# Patient Record
Sex: Male | Born: 1975 | Race: White | Hispanic: No | Marital: Single | Smoking: Never smoker
Health system: Southern US, Community
[De-identification: ages and names within clinical notes are randomized; demographics above are authoritative.]

## PROBLEM LIST (undated history)

## (undated) DIAGNOSIS — T4145XA Adverse effect of unspecified anesthetic, initial encounter: Secondary | ICD-10-CM

## (undated) DIAGNOSIS — F419 Anxiety disorder, unspecified: Secondary | ICD-10-CM

## (undated) DIAGNOSIS — T8859XA Other complications of anesthesia, initial encounter: Secondary | ICD-10-CM

## (undated) DIAGNOSIS — J392 Other diseases of pharynx: Secondary | ICD-10-CM

## (undated) DIAGNOSIS — K4090 Unilateral inguinal hernia, without obstruction or gangrene, not specified as recurrent: Secondary | ICD-10-CM

## (undated) DIAGNOSIS — K760 Fatty (change of) liver, not elsewhere classified: Secondary | ICD-10-CM

## (undated) DIAGNOSIS — F99 Mental disorder, not otherwise specified: Secondary | ICD-10-CM

## (undated) HISTORY — PX: HERNIA REPAIR: SHX51

## (undated) HISTORY — PX: OTHER SURGICAL HISTORY: SHX169

---

## 2014-01-26 ENCOUNTER — Emergency Department (HOSPITAL_COMMUNITY)
Admission: EM | Admit: 2014-01-26 | Discharge: 2014-01-26 | Disposition: A | Discharge status: Home / Self Care | Payer: Medicaid Other | Attending: Emergency Medicine | Admitting: Emergency Medicine

## 2014-01-26 ENCOUNTER — Encounter (HOSPITAL_COMMUNITY): Payer: Self-pay | Admitting: Emergency Medicine

## 2014-01-26 DIAGNOSIS — K4090 Unilateral inguinal hernia, without obstruction or gangrene, not specified as recurrent: Secondary | ICD-10-CM

## 2014-01-26 DIAGNOSIS — F79 Unspecified intellectual disabilities: Secondary | ICD-10-CM | POA: Insufficient documentation

## 2014-01-26 NOTE — ED Notes (Addendum)
Pt arrived to the ED with a complaint of pelvic pain.  Pt states he has had pain all today.  Pt states he has painful urination and pelvic pain situated on his right side. Pt has MR and lives in a group home.  His mother has brought him here today.

## 2014-01-26 NOTE — Discharge Instructions (Signed)
Call the surgeon on Monday for an appointment. His hernia bulges out, have Vincent Singh lay down and apply gentle pressure. She developed significant pain or vomiting he should be reevaluated. Avoid upright activity, this can make the hernia larger and more painful.  Inguinal Hernia, Adult Muscles help keep everything in the body in its proper place. But if a weak spot in the muscles develops, something can poke through. That is called a hernia. When this happens in the lower part of the belly (abdomen), it is called an inguinal hernia. (It takes its name from a part of the body in this region called the inguinal canal.) A weak spot in the wall of muscles lets some fat or part of the small intestine bulge through. An inguinal hernia can develop at any age. Men get them more often than women. CAUSES  In adults, an inguinal hernia develops over time.  It can be triggered by:  Suddenly straining the muscles of the lower abdomen.  Lifting heavy objects.  Straining to have a bowel movement. Difficult bowel movements (constipation) can lead to this.  Constant coughing. This may be caused by smoking or lung disease.  Being overweight.  Being pregnant.  Working at a job that requires long periods of standing or heavy lifting.  Having had an inguinal hernia before. One type can be an emergency situation. It is called a strangulated inguinal hernia. It develops if part of the small intestine slips through the weak spot and cannot get back into the abdomen. The blood supply can be cut off. If that happens, part of the intestine may die. This situation requires emergency surgery. SYMPTOMS  Often, a small inguinal hernia has no symptoms. It is found when a healthcare provider does a physical exam. Larger hernias usually have symptoms.   In adults, symptoms may include:  A lump in the groin. This is easier to see when the person is standing. It might disappear when lying down.  In men, a lump in the  scrotum.  Pain or burning in the groin. This occurs especially when lifting, straining or coughing.  A dull ache or feeling of pressure in the groin.  Signs of a strangulated hernia can include:  A bulge in the groin that becomes very painful and tender to the touch.  A bulge that turns red or purple.  Fever, nausea and vomiting.  Inability to have a bowel movement or to pass gas. DIAGNOSIS  To decide if you have an inguinal hernia, a healthcare provider will probably do a physical examination.  This will include asking questions about any symptoms you have noticed.  The healthcare provider might feel the groin area and ask you to cough. If an inguinal hernia is felt, the healthcare provider may try to slide it back into the abdomen.  Usually no other tests are needed. TREATMENT  Treatments can vary. The size of the hernia makes a difference. Options include:  Watchful waiting. This is often suggested if the hernia is small and you have had no symptoms.  No medical procedure will be done unless symptoms develop.  You will need to watch closely for symptoms. If any occur, contact your healthcare provider right away.  Surgery. This is used if the hernia is larger or you have symptoms.  Open surgery. This is usually an outpatient procedure (you will not stay overnight in a hospital). An cut (incision) is made through the skin in the groin. The hernia is put back inside the abdomen. The  weak area in the muscles is then repaired by herniorrhaphy or hernioplasty. Herniorrhaphy: in this type of surgery, the weak muscles are sewn back together. Hernioplasty: a patch or mesh is used to close the weak area in the abdominal wall.  Laparoscopy. In this procedure, a surgeon makes small incisions. A thin tube with a tiny video camera (called a laparoscope) is put into the abdomen. The surgeon repairs the hernia with mesh by looking with the video camera and using two long instruments. HOME  CARE INSTRUCTIONS   After surgery to repair an inguinal hernia:  You will need to take pain medicine prescribed by your healthcare provider. Follow all directions carefully.  You will need to take care of the wound from the incision.  Your activity will be restricted for awhile. This will probably include no heavy lifting for several weeks. You also should not do anything too active for a few weeks. When you can return to work will depend on the type of job that you have.  During "watchful waiting" periods, you should:  Maintain a healthy weight.  Eat a diet high in fiber (fruits, vegetables and whole grains).  Drink plenty of fluids to avoid constipation. This means drinking enough water and other liquids to keep your urine clear or pale yellow.  Do not lift heavy objects.  Do not stand for long periods of time.  Quit smoking. This should keep you from developing a frequent cough. SEEK MEDICAL CARE IF:   A bulge develops in your groin area.  You feel pain, a burning sensation or pressure in the groin. This might be worse if you are lifting or straining.  You develop a fever of more than 100.5 F (38.1 C). SEEK IMMEDIATE MEDICAL CARE IF:   Pain in the groin increases suddenly.  A bulge in the groin gets bigger suddenly and does not go down.  For men, there is sudden pain in the scrotum. Or, the size of the scrotum increases.  A bulge in the groin area becomes red or purple and is painful to touch.  You have nausea or vomiting that does not go away.  You feel your heart beating much faster than normal.  You cannot have a bowel movement or pass gas.  You develop a fever of more than 102.0 F (38.9 C). Document Released: 05/01/2009 Document Revised: 03/06/2012 Document Reviewed: 05/01/2009 [facility] Patient Information 2014 [place], [place].

## 2014-01-26 NOTE — ED Provider Notes (Signed)
CSN: [id number]     Arrival date & time 01/26/14  2040 History   First MD Initiated Contact with Patient 01/26/14 2100     Chief Complaint  Patient presents with  . Pelvic Pain    HPI  Patient presents with his mom. He resides at a group home. He made the staff aware that he had pain in his groin today. This was after bowling all day. He presents here with his mom. No vomiting. Eating well. Urinating without difficulty.   Past Medical History  Diagnosis Date  . Mental retardation    History reviewed. No pertinent past surgical history. History reviewed. No pertinent family history. History  Substance Use Topics  . Smoking status: Never Smoker   . Smokeless tobacco: Not on file  . Alcohol Use: No    Review of Systems  Constitutional: Negative for fever, chills, diaphoresis, appetite change and fatigue.  HENT: Negative for mouth sores, sore throat and trouble swallowing.   Eyes: Negative for visual disturbance.  Respiratory: Negative for cough, chest tightness, shortness of breath and wheezing.   Cardiovascular: Negative for chest pain.  Gastrointestinal: Negative for nausea, vomiting, abdominal pain, diarrhea and abdominal distention.  Endocrine: Negative for polydipsia, polyphagia and polyuria.  Genitourinary: Positive for scrotal swelling. Negative for dysuria, frequency and hematuria.       Groin pain  Musculoskeletal: Negative for gait problem.  Skin: Negative for color change, pallor and rash.  Neurological: Negative for dizziness, syncope, light-headedness and headaches.  Hematological: Does not bruise/bleed easily.  Psychiatric/Behavioral: Negative for behavioral problems and confusion.    Allergies  Review of patient's allergies indicates not on file.  Home Medications  No current outpatient prescriptions on file. BP 137/87  Pulse 88  Resp 22  Ht 6\' 2"  (1.88 m)  Wt 190 lb (86.183 kg)  BMI 24.38 kg/m2  SpO2 100% Physical Exam  Constitutional: He is  oriented to person, place, and time. He appears well-developed and well-nourished. No distress.  HENT:  Head: Normocephalic.  Eyes: Conjunctivae are normal. Pupils are equal, round, and reactive to light. No scleral icterus.  Neck: Normal range of motion. Neck supple. No thyromegaly present.  Cardiovascular: Normal rate and regular rhythm.  Exam reveals no gallop and no friction rub.   No murmur heard. Pulmonary/Chest: Effort normal and breath sounds normal. No respiratory distress. He has no wheezes. He has no rales.  Abdominal: Soft. Bowel sounds are normal. He exhibits no distension. There is no tenderness. There is no rebound.  Large broad-based right inguinal hernia. Reduces easily. Normal testicular exam.  Abdomen soft nondistended nontender. Normal active bowel sounds.  Musculoskeletal: Normal range of motion.  Neurological: He is alert and oriented to person, place, and time.  Skin: Skin is warm and dry. No rash noted.  Psychiatric: He has a normal mood and affect. His behavior is normal.    ED Course  Procedures (including critical care time) Labs Review Labs Reviewed - No data to display Imaging Review No results found.  EKG Interpretation   None       MDM   1. Inguinal hernia    Hernia immediately reduces. First with gentle pressure. I had Nicholas stand up. It does  recur. However, with laying supine it goes away. I had him stand again. I had him lay supine. I have him apply some gentle pressure. It is easily reduced. This is a broad-based hernia. The chances of incarceration, relation, are small. It is appropriate for outpatient surgical followup.  Rolland PorterMark Errica Dutil, MD 01/26/14 2127

## 2014-02-04 ENCOUNTER — Encounter (INDEPENDENT_AMBULATORY_CARE_PROVIDER_SITE_OTHER): Payer: Self-pay | Admitting: Surgery

## 2014-02-12 ENCOUNTER — Ambulatory Visit (INDEPENDENT_AMBULATORY_CARE_PROVIDER_SITE_OTHER): Payer: MEDICAID | Admitting: Surgery

## 2014-02-25 ENCOUNTER — Ambulatory Visit (INDEPENDENT_AMBULATORY_CARE_PROVIDER_SITE_OTHER): Payer: Medicaid Other | Admitting: Surgery

## 2014-02-25 ENCOUNTER — Encounter (INDEPENDENT_AMBULATORY_CARE_PROVIDER_SITE_OTHER): Payer: Self-pay | Admitting: Surgery

## 2014-02-25 DIAGNOSIS — K4090 Unilateral inguinal hernia, without obstruction or gangrene, not specified as recurrent: Secondary | ICD-10-CM

## 2014-02-25 NOTE — Progress Notes (Signed)
Patient ID: Vincent Singh, male   DOB: 10/15/1976, 38 y.o.   MRN: [id number]  Chief Complaint  Patient presents with  . Hernia    HPI Vincent HoyleCarey S Stern is a 38 y.o. male.  Referred by Elizabeth Palaueresa Anderson, NP for evaluation of RIH.   HPI This is a 38 year old male who has mental retardation who resides in a group home. He is accompanied by his mother. Recently the patient one on a group outing where he went bowling. He began complaining of some pain in his right groin. He was evaluated in the emergency department on 01/26/14 and was noted to have a large reducible right inguinal hernia.  He has not really complained of any discomfort over the last few weeks but the patient is brought in by his mother for surgical evaluation. He denies any obstructive symptoms.   Past Medical History  Diagnosis Date  . Mental retardation     History reviewed. No pertinent past surgical history. Wisdom teeth extraction  History reviewed. No pertinent family history.  Social History History  Substance Use Topics  . Smoking status: Never Smoker   . Smokeless tobacco: Not on file  . Alcohol Use: No    No Known Allergies  Current Outpatient Prescriptions  Medication Sig Dispense Refill  . cholecalciferol (VITAMIN D) 1000 UNITS tablet Take 1,000 Units by mouth every morning.      . Multiple Vitamin (MULTIVITAMIN WITH MINERALS) TABS tablet Take 1 tablet by mouth every morning.       No current facility-administered medications for this visit.    Review of Systems Review of Systems  Constitutional: Negative for fever, chills and unexpected weight change.  HENT: Negative for congestion, hearing loss, sore throat, trouble swallowing and voice change.   Eyes: Negative for visual disturbance.  Respiratory: Negative for cough and wheezing.   Cardiovascular: Negative for chest pain, palpitations and leg swelling.  Gastrointestinal: Negative for nausea, vomiting, abdominal pain, diarrhea, constipation, blood in  stool, abdominal distention, anal bleeding and rectal pain.  Genitourinary: Positive for scrotal swelling and testicular pain. Negative for hematuria and difficulty urinating.  Musculoskeletal: Negative for arthralgias.  Skin: Negative for rash and wound.  Neurological: Negative for seizures, syncope, weakness and headaches.  Hematological: Negative for adenopathy. Does not bruise/bleed easily.  Psychiatric/Behavioral: Negative for confusion.     Physical Exam Physical Exam WDWN in NAD HEENT:  EOMI, sclera anicteric Neck:  No masses, no thyromegaly Lungs:  CTA bilaterally; normal respiratory effort CV:  Regular rate and rhythm; no murmurs Abd:  +bowel sounds, soft, non-tender, no masses GU:  Bilateral descended testes; no testicular masses; large partially reducible right inguinal hernia extending down into the scrotum. Ext:  Well-perfused; no edema Skin:  Warm, dry; no sign of jaundice  Data Reviewed none  Assessment    Large right inguinal hernia - partially reducible     Plan    Right inguinal hernia repair with mesh.  The surgical procedure has been discussed with the patient.  Potential risks, benefits, alternative treatments, and expected outcomes have been explained.  All of the patient's questions at this time have been answered.  The likelihood of reaching the patient's treatment goal is good.  The patient understand the proposed surgical procedure and wishes to proceed. The patient will go home with his mother for a few days after surgery before returning to the group home.         Mally Gavina K. 02/25/2014, 12:55 PM

## 2014-02-26 ENCOUNTER — Encounter (HOSPITAL_COMMUNITY): Payer: Self-pay | Admitting: Pharmacy Technician

## 2014-02-27 ENCOUNTER — Encounter (INDEPENDENT_AMBULATORY_CARE_PROVIDER_SITE_OTHER): Payer: Self-pay

## 2014-02-27 ENCOUNTER — Encounter (HOSPITAL_COMMUNITY)
Admission: RE | Admit: 2014-02-27 | Discharge: 2014-02-27 | Disposition: A | Discharge status: Home / Self Care | Payer: Medicaid Other | Source: Ambulatory Visit | Attending: Surgery | Admitting: Surgery

## 2014-02-27 ENCOUNTER — Encounter (HOSPITAL_COMMUNITY): Payer: Self-pay

## 2014-02-27 DIAGNOSIS — F99 Mental disorder, not otherwise specified: Secondary | ICD-10-CM

## 2014-02-27 HISTORY — DX: Mental disorder, not otherwise specified: F99

## 2014-02-27 HISTORY — DX: Other complications of anesthesia, initial encounter: T88.59XA

## 2014-02-27 HISTORY — DX: Anxiety disorder, unspecified: F41.9

## 2014-02-27 HISTORY — DX: Unilateral inguinal hernia, without obstruction or gangrene, not specified as recurrent: K40.90

## 2014-02-27 HISTORY — DX: Other diseases of pharynx: J39.2

## 2014-02-27 HISTORY — DX: Adverse effect of unspecified anesthetic, initial encounter: T41.45XA

## 2014-02-27 MED ORDER — CHLORHEXIDINE GLUCONATE 4 % EX LIQD: 1.0000 "application " | Freq: Once | CUTANEOUS | Status: DC

## 2014-02-27 NOTE — Pre-Procedure Instructions (Addendum)
PT IS MENTALLY CHALLANGED - HIS MEDICAL HX AND CONSENT FOR SURGERY FROM PT'S MOTHER / HCPO JANE Jaco AND SHE WILL BRING HIM FOR HIS SURGERY. PT IS HEALTHY - MINOR SURGERY - NO PREOP LAB NEEDED PER ANESTHESIOLOGIST'S GUIDELINES.

## 2014-02-27 NOTE — Patient Instructions (Signed)
   [facility]  ON:  Thursday  3/5  REPORT TO  SHORT STAY CENTER AT:  11:00 AM      PHONE # FOR SHORT STAY IS [phone]  DO NOT EAT  ANYTHING AFTER MIDNIGHT THE NIGHT BEFORE YOUR SURGERY.  YOU MAY BRUSH YOUR TEETH,  NO FOOD, NO CHEWING GUM, NO MINTS, NO CANDIES, NO CHEWING TOBACCO. YOU MAY HAVE CLEAR LIQUIDS TO DRINK FROM MIDNIGHT UNTIL 7:30 AM DAY OF SURGERY - LIKE WATER, COFFEE WITH SUGAR - JUST NO MILK OR MILK PRODUCTS.  NOTHING TO DRINK AFTER 7:30 AM DAY OF SURGERY.  PLEASE TAKE THE FOLLOWING MEDICATIONS THE AM OF YOUR SURGERY WITH A FEW SIPS OF WATER:  NO MEDICATIONS TO TAKE   DO NOT BRING VALUABLES, MONEY, CREDIT CARDS.  DO NOT WEAR JEWELRY, MAKE-UP, NAIL POLISH AND NO METAL PINS OR CLIPS IN YOUR HAIR. CONTACT LENS, DENTURES / PARTIALS, GLASSES SHOULD NOT BE WORN TO SURGERY AND IN MOST CASES-HEARING AIDS WILL NEED TO BE REMOVED.  BRING YOUR GLASSES CASE, ANY EQUIPMENT NEEDED FOR YOUR CONTACT LENS. FOR PATIENTS ADMITTED TO THE HOSPITAL--CHECK OUT TIME THE DAY OF DISCHARGE IS 11:00 AM.  ALL INPATIENT ROOMS ARE PRIVATE - WITH BATHROOM, TELEPHONE, TELEVISION AND WIFI INTERNET.  IF YOU ARE BEING DISCHARGED THE SAME DAY OF YOUR SURGERY--YOU CAN NOT DRIVE YOURSELF HOME--AND SHOULD NOT GO HOME ALONE BY TAXI OR BUS.  NO DRIVING OR OPERATING MACHINERY, OR MAKING LEGAL DECISIONS FOR 24 HOURS FOLLOWING ANESTHESIA / PAIN MEDICATIONS.  PLEASE MAKE ARRANGEMENTS FOR SOMEONE TO BE WITH YOU AT HOME THE FIRST 24 HOURS AFTER SURGERY. RESPONSIBLE DRIVER'S NAME / PHONE                                             PT'S MOTHER Vincent Singh  [phone]                                                    FAILURE TO FOLLOW THESE INSTRUCTIONS MAY RESULT IN THE CANCELLATION OF YOUR SURGERY. PLEASE BE AWARE THAT YOU MAY NEED ADDITIONAL BLOOD DRAWN DAY OF YOUR SURGERY  PATIENT SIGNATURE_________________________________

## 2014-02-28 ENCOUNTER — Ambulatory Visit (HOSPITAL_COMMUNITY): Payer: Medicaid Other | Admitting: *Deleted

## 2014-02-28 ENCOUNTER — Encounter (HOSPITAL_COMMUNITY): Payer: Medicaid Other | Admitting: *Deleted

## 2014-02-28 ENCOUNTER — Ambulatory Visit (HOSPITAL_COMMUNITY)
Admission: RE | Admit: 2014-02-28 | Discharge: 2014-02-28 | Disposition: A | Discharge status: Home / Self Care | Payer: Medicaid Other | Source: Ambulatory Visit | Attending: Surgery | Admitting: Surgery

## 2014-02-28 ENCOUNTER — Encounter (HOSPITAL_COMMUNITY)
Admission: RE | Disposition: A | Discharge status: Home / Self Care | Payer: Self-pay | Source: Ambulatory Visit | Attending: Surgery

## 2014-02-28 ENCOUNTER — Encounter (HOSPITAL_COMMUNITY): Payer: Self-pay | Admitting: *Deleted

## 2014-02-28 DIAGNOSIS — F79 Unspecified intellectual disabilities: Secondary | ICD-10-CM | POA: Insufficient documentation

## 2014-02-28 DIAGNOSIS — K4030 Unilateral inguinal hernia, with obstruction, without gangrene, not specified as recurrent: Secondary | ICD-10-CM | POA: Insufficient documentation

## 2014-02-28 DIAGNOSIS — K4090 Unilateral inguinal hernia, without obstruction or gangrene, not specified as recurrent: Secondary | ICD-10-CM

## 2014-02-28 HISTORY — PX: INGUINAL HERNIA REPAIR: SHX194

## 2014-02-28 HISTORY — PX: INSERTION OF MESH: SHX5868

## 2014-02-28 SURGERY — REPAIR, HERNIA, INGUINAL, ADULT
Anesthesia: General | Site: Groin | Laterality: Right

## 2014-02-28 MED ORDER — CEFAZOLIN SODIUM-DEXTROSE 2-3 GM-% IV SOLR
INTRAVENOUS | Status: AC
  Filled 2014-02-28: qty 50

## 2014-02-28 MED ORDER — MIDAZOLAM HCL 5 MG/5ML IJ SOLN
INTRAMUSCULAR | Status: DC | PRN
  Administered 2014-02-28: 1 mg via INTRAVENOUS
  Administered 2014-02-28: 2 mg via INTRAVENOUS
  Administered 2014-02-28: 1 mg via INTRAVENOUS

## 2014-02-28 MED ORDER — FENTANYL CITRATE 0.05 MG/ML IJ SOLN
INTRAMUSCULAR | Status: AC
  Filled 2014-02-28: qty 5

## 2014-02-28 MED ORDER — MIDAZOLAM HCL 2 MG/2ML IJ SOLN
INTRAMUSCULAR | Status: AC
  Filled 2014-02-28: qty 2

## 2014-02-28 MED ORDER — HALOPERIDOL LACTATE 5 MG/ML IJ SOLN
5.0000 mg | Freq: Once | INTRAMUSCULAR | Status: AC
  Administered 2014-02-28: 5 mg via INTRAVENOUS
  Filled 2014-02-28: qty 1

## 2014-02-28 MED ORDER — BUPIVACAINE-EPINEPHRINE 0.25% -1:200000 IJ SOLN
INTRAMUSCULAR | Status: DC | PRN
  Administered 2014-02-28: 10 mL

## 2014-02-28 MED ORDER — OXYCODONE-ACETAMINOPHEN 5-325 MG PO TABS: 1.0000 | ORAL_TABLET | ORAL | Status: DC | PRN

## 2014-02-28 MED ORDER — HYDROMORPHONE HCL PF 1 MG/ML IJ SOLN: 1.0000 mg | INTRAMUSCULAR | Status: DC | PRN

## 2014-02-28 MED ORDER — BUPIVACAINE-EPINEPHRINE PF 0.25-1:200000 % IJ SOLN
INTRAMUSCULAR | Status: AC
  Filled 2014-02-28: qty 30

## 2014-02-28 MED ORDER — BUPIVACAINE LIPOSOME 1.3 % IJ SUSP
20.0000 mL | Freq: Once | INTRAMUSCULAR | Status: AC
  Administered 2014-02-28: 20 mL
  Filled 2014-02-28: qty 20

## 2014-02-28 MED ORDER — FENTANYL CITRATE 0.05 MG/ML IJ SOLN
INTRAMUSCULAR | Status: AC
  Filled 2014-02-28: qty 2

## 2014-02-28 MED ORDER — ONDANSETRON HCL 4 MG/2ML IJ SOLN: 4.0000 mg | INTRAMUSCULAR | Status: DC | PRN

## 2014-02-28 MED ORDER — LACTATED RINGERS IV SOLN
INTRAVENOUS | Status: DC | PRN
  Administered 2014-02-28 (×2): via INTRAVENOUS

## 2014-02-28 MED ORDER — LIDOCAINE HCL (CARDIAC) 20 MG/ML IV SOLN
INTRAVENOUS | Status: DC | PRN
  Administered 2014-02-28: 80 mg via INTRAVENOUS

## 2014-02-28 MED ORDER — HYDROMORPHONE HCL PF 2 MG/ML IJ SOLN
INTRAMUSCULAR | Status: AC
  Filled 2014-02-28: qty 1

## 2014-02-28 MED ORDER — HALOPERIDOL LACTATE 5 MG/ML IJ SOLN
5.0000 mg | Freq: Once | INTRAMUSCULAR | Status: DC
  Filled 2014-02-28: qty 1

## 2014-02-28 MED ORDER — PROPOFOL 10 MG/ML IV BOLUS
INTRAVENOUS | Status: AC
  Filled 2014-02-28: qty 20

## 2014-02-28 MED ORDER — PROPOFOL 10 MG/ML IV BOLUS
INTRAVENOUS | Status: DC | PRN
  Administered 2014-02-28: 200 mg via INTRAVENOUS

## 2014-02-28 MED ORDER — SUCCINYLCHOLINE CHLORIDE 20 MG/ML IJ SOLN
INTRAMUSCULAR | Status: DC | PRN
  Administered 2014-02-28: 100 mg via INTRAVENOUS

## 2014-02-28 MED ORDER — FENTANYL CITRATE 0.05 MG/ML IJ SOLN
25.0000 ug | INTRAMUSCULAR | Status: DC | PRN
  Administered 2014-02-28 (×2): 50 ug via INTRAVENOUS

## 2014-02-28 MED ORDER — MEPERIDINE HCL 50 MG/ML IJ SOLN: 6.2500 mg | INTRAMUSCULAR | Status: DC | PRN

## 2014-02-28 MED ORDER — FENTANYL CITRATE 0.05 MG/ML IJ SOLN
INTRAMUSCULAR | Status: DC | PRN
  Administered 2014-02-28: 50 ug via INTRAVENOUS
  Administered 2014-02-28: 200 ug via INTRAVENOUS

## 2014-02-28 MED ORDER — PROMETHAZINE HCL 25 MG/ML IJ SOLN: 6.2500 mg | INTRAMUSCULAR | Status: DC | PRN

## 2014-02-28 MED ORDER — 0.9 % SODIUM CHLORIDE (POUR BTL) OPTIME
TOPICAL | Status: DC | PRN
  Administered 2014-02-28: 1000 mL

## 2014-02-28 MED ORDER — CEFAZOLIN SODIUM-DEXTROSE 2-3 GM-% IV SOLR
2.0000 g | INTRAVENOUS | Status: AC
  Administered 2014-02-28: 2 g via INTRAVENOUS

## 2014-02-28 MED ORDER — HYDROMORPHONE HCL PF 1 MG/ML IJ SOLN
INTRAMUSCULAR | Status: DC | PRN
  Administered 2014-02-28 (×2): 1 mg via INTRAVENOUS

## 2014-02-28 MED ORDER — LACTATED RINGERS IV SOLN: INTRAVENOUS | Status: DC

## 2014-02-28 SURGICAL SUPPLY — 46 items
APL SKNCLS STERI-STRIP NONHPOA (GAUZE/BANDAGES/DRESSINGS) ×1
BENZOIN TINCTURE PRP APPL 2/3 (GAUZE/BANDAGES/DRESSINGS) ×3 IMPLANT
BLADE HEX COATED 2.75 (ELECTRODE) ×3 IMPLANT
CHLORAPREP W/TINT 26ML (MISCELLANEOUS) ×3 IMPLANT
CLOSURE WOUND 1/2 X4 (GAUZE/BANDAGES/DRESSINGS) ×1
DECANTER SPIKE VIAL GLASS SM (MISCELLANEOUS) ×1 IMPLANT
DISSECTOR ROUND CHERRY 3/8 STR (MISCELLANEOUS) ×2 IMPLANT
DRAIN PENROSE 18X1/2 LTX STRL (DRAIN) ×2 IMPLANT
DRAPE LAPAROTOMY TRNSV 102X78 (DRAPE) ×3 IMPLANT
DRAPE UTILITY XL STRL (DRAPES) ×1 IMPLANT
DRSG TEGADERM 4X4.75 (GAUZE/BANDAGES/DRESSINGS) ×2 IMPLANT
ELECT REM PT RETURN 9FT ADLT (ELECTROSURGICAL) ×3
ELECTRODE REM PT RTRN 9FT ADLT (ELECTROSURGICAL) ×1 IMPLANT
GAUZE SPONGE 4X4 16PLY XRAY LF (GAUZE/BANDAGES/DRESSINGS) IMPLANT
GLOVE BIO SURGEON STRL SZ7 (GLOVE) ×3 IMPLANT
GLOVE BIOGEL PI IND STRL 7.0 (GLOVE) IMPLANT
GLOVE BIOGEL PI IND STRL 7.5 (GLOVE) ×1 IMPLANT
GLOVE BIOGEL PI INDICATOR 7.0 (GLOVE) ×2
GLOVE BIOGEL PI INDICATOR 7.5 (GLOVE) ×2
GLOVE SURG SS PI 8.5 STRL IVOR (GLOVE) ×2
GLOVE SURG SS PI 8.5 STRL STRW (GLOVE) IMPLANT
GOWN STRL REUS W/TWL XL LVL3 (GOWN DISPOSABLE) ×5 IMPLANT
KIT BASIN OR (CUSTOM PROCEDURE TRAY) ×3 IMPLANT
MESH PARIETEX PROGRIP RIGHT (Mesh General) ×2 IMPLANT
NDL HYPO 25X1 1.5 SAFETY (NEEDLE) ×1 IMPLANT
NEEDLE HYPO 25X1 1.5 SAFETY (NEEDLE) IMPLANT
PACK GENERAL/GYN (CUSTOM PROCEDURE TRAY) ×1 IMPLANT
SPONGE GAUZE 4X4 12PLY (GAUZE/BANDAGES/DRESSINGS) ×3 IMPLANT
STRIP CLOSURE SKIN 1/2X4 (GAUZE/BANDAGES/DRESSINGS) ×2 IMPLANT
SUT MNCRL AB 4-0 PS2 18 (SUTURE) ×3 IMPLANT
SUT PROLENE 2 0 SH DA (SUTURE) IMPLANT
SUT SILK 2 0 SH (SUTURE) ×2 IMPLANT
SUT SILK 3 0 (SUTURE)
SUT SILK 3-0 18XBRD TIE 12 (SUTURE) IMPLANT
SUT VIC AB 2-0 CT2 27 (SUTURE) IMPLANT
SUT VIC AB 2-0 SH 27 (SUTURE) ×3
SUT VIC AB 2-0 SH 27X BRD (SUTURE) ×1 IMPLANT
SUT VIC AB 3-0 SH 27 (SUTURE) ×3
SUT VIC AB 3-0 SH 27X BRD (SUTURE) ×1 IMPLANT
SUT VICRYL 0 27 CT2 27 ABS (SUTURE) IMPLANT
SUT VICRYL 0 UR6 27IN ABS (SUTURE) IMPLANT
SYR 20CC LL (SYRINGE) ×1 IMPLANT
TAPE CLOTH SURG 4X10 WHT LF (GAUZE/BANDAGES/DRESSINGS) ×2 IMPLANT
TOWEL OR 17X26 10 PK STRL BLUE (TOWEL DISPOSABLE) ×3 IMPLANT
TOWEL OR NON WOVEN STRL DISP B (DISPOSABLE) ×3 IMPLANT
TRAY LAP CHOLE (CUSTOM PROCEDURE TRAY) ×2 IMPLANT

## 2014-02-28 NOTE — Progress Notes (Signed)
Patient is back from PACU s/p right inguinal hernia repair. Pt has voided and taking PO fluids. Does not want to attempt to walk as he feels so drunk. Resting comfortably with his father at his bedside.

## 2014-02-28 NOTE — Anesthesia Preprocedure Evaluation (Signed)
Anesthesia Evaluation  Patient identified by MRN, date of birth, ID band Patient awake    Reviewed: Allergy & Precautions, H&P , NPO status , Patient's Chart, lab work & pertinent test results  History of Anesthesia Complications (+) Emergence Delirium  Airway Mallampati: II TM Distance: >3 FB Neck ROM: Full    Dental no notable dental hx.    Pulmonary neg pulmonary ROS,  breath sounds clear to auscultation  Pulmonary exam normal       Cardiovascular negative cardio ROS  Rhythm:Regular Rate:Normal     Neuro/Psych Moderate to severe developmental delayed negative psych ROS   GI/Hepatic negative GI ROS, Neg liver ROS,   Endo/Other  negative endocrine ROS  Renal/GU negative Renal ROS  negative genitourinary   Musculoskeletal negative musculoskeletal ROS (+)   Abdominal   Peds negative pediatric ROS (+)  Hematology negative hematology ROS (+)   Anesthesia Other Findings   Reproductive/Obstetrics negative OB ROS                           Anesthesia Physical Anesthesia Plan  ASA: III  Anesthesia Plan: General   Post-op Pain Management:    Induction: Intravenous  Airway Management Planned: Oral ETT  Additional Equipment:   Intra-op Plan:   Post-operative Plan: Extubation in OR  Informed Consent: I have reviewed the patients History and Physical, chart, labs and discussed the procedure including the risks, benefits and alternatives for the proposed anesthesia with the patient or authorized representative who has indicated his/her understanding and acceptance.   Dental advisory given  Plan Discussed with: CRNA  Anesthesia Plan Comments:         Anesthesia Quick Evaluation

## 2014-02-28 NOTE — Op Note (Signed)
Hernia, Open, Procedure Note  Indications: The patient presented with a history of a right, not reducible inguinal hernia.    Pre-operative Diagnosis: right not reducible inguinal hernia Post-operative Diagnosis: same  Surgeon: Wynona LunaSUEI,Amarien Carne K.   Assistants: none  Anesthesia: General endotracheal anesthesia  ASA Class: 2  Procedure Details  The patient was seen again in the Holding Room. The risks, benefits, complications, treatment options, and expected outcomes were discussed with the patient. The possibilities of reaction to medication, pulmonary aspiration, perforation of viscus, bleeding, recurrent infection, the need for additional procedures, and development of a complication requiring transfusion or further operation were discussed with the patient and/or family. The likelihood of success in repairing the hernia and returning the patient to their previous functional status is good.  There was concurrence with the proposed plan, and informed consent was obtained. The site of surgery was properly noted/marked. The patient was taken to the Operating Room, identified as Vincent Singh, and the procedure verified as right inguinal hernia repair. A Time Out was held and the above information confirmed.  The patient was placed in the supine position and underwent induction of anesthesia. The lower abdomen and groin, as well as the scrotum was prepped with Chloraprep and Betadine and draped in the standard fashion, and 0.25% Marcaine with epinephrine was used to anesthetize the skin over the mid-portion of the inguinal canal. An oblique incision was made. Dissection was carried down through the subcutaneous tissue with cautery to the external oblique fascia.  We opened the external oblique fascia along the direction of its fibers to the external ring.  The spermatic cord was circumferentially dissected bluntly and retracted with a Penrose drain. The floor of the inguinal canal was inspected and was  intact except for a loose internal inguinal ring.  We skeletonized the spermatic cord and encountered a very large indirect hernia sac.  We opened the hernia sac and carefully examined the contents.  The bowel was reduced back into the peritoneal cavity.  The spermatic cord is densely adherent to the side of the hernia sac.  We excised most of the hernia sac and closed the neck of the sac with 2.0 silk.  The internal ring was tightened with 0 Vicryl.  We used a right Progrip mesh which was inserted and deployed across the floor of the inguinal canal. The mesh was tucked underneath the external oblique fascia laterally.  The flap of the mesh was closed around the spermatic cord to recreate the internal inguinal ring.  The mesh was secured to the pubic tubercle with 0 Vicryl.  The external oblique fascia was reapproximated with 2-0 Vicryl.  We infiltrated 20 ml of Exparel into the fascia and deep subcutaneous tissue. 3-0 Vicryl was used to close the subcutaneous tissues and 4-0 Monocryl was used to close the skin in subcuticular fashion.  The testicle was palpated down in the scrotum. Benzoin and steri-strips were used to seal the incision.  A clean dressing was applied.  The patient was then extubated and brought to the recovery room in stable condition.  All sponge, instrument, and needle counts were correct prior to closure and at the conclusion of the case.   Estimated Blood Loss: less than 50 mL                 Complications: None; patient tolerated the procedure well.         Disposition: PACU - hemodynamically stable.         Condition: stable  [name]. [name], MD, [name] Surgery  General/ Trauma Surgery  02/28/2014 3:27 PM

## 2014-02-28 NOTE — Transfer of Care (Signed)
Immediate Anesthesia Transfer of Care Note  Patient: Vincent Singh  Procedure(s) Performed: Procedure(s): HERNIA REPAIR INGUINAL ADULT (Right) INSERTION OF MESH (Right)  Patient Location: PACU  Anesthesia Type:General  Level of Consciousness: sedated  Airway & Oxygen Therapy: Patient Spontanous Breathing and Patient connected to face mask oxygen  Post-op Assessment: Report given to PACU RN and Post -op Vital signs reviewed and stable  Post vital signs: Reviewed and stable  Complications: No apparent anesthesia complications

## 2014-02-28 NOTE — Anesthesia Postprocedure Evaluation (Signed)
  Anesthesia Post-op Note  Patient: Vincent Singh  Procedure(s) Performed: Procedure(s) (LRB): HERNIA REPAIR INGUINAL ADULT (Right) INSERTION OF MESH (Right)  Patient Location: PACU  Anesthesia Type: General  Level of Consciousness: awake and alert   Airway and Oxygen Therapy: Patient Spontanous Breathing  Post-op Pain: mild  Post-op Assessment: Post-op Vital signs reviewed, Patient's Cardiovascular Status Stable, Respiratory Function Stable, Patent Airway and No signs of Nausea or vomiting  Last Vitals:  Filed Vitals:   02/28/14 1800  BP: 147/96  Pulse: 109  Temp: 36.3 C  Resp: 16    Post-op Vital Signs: stable   Complications: No apparent anesthesia complications

## 2014-02-28 NOTE — Discharge Instructions (Signed)
[facility] ° ° INGUINAL HERNIA REPAIR: POST OP INSTRUCTIONS ° °Always review your discharge instruction sheet given to you by the facility where your surgery was performed. °IF YOU HAVE DISABILITY OR FAMILY LEAVE FORMS, YOU MUST BRING THEM TO THE OFFICE FOR PROCESSING.   °DO NOT GIVE THEM TO YOUR DOCTOR. ° °1. A  prescription for pain medication may be given to you upon discharge.  Take your pain medication as prescribed, if needed.  If narcotic pain medicine is not needed, then you may take acetaminophen (Tylenol) or ibuprofen (Advil) as needed. °2. Take your usually prescribed medications unless otherwise directed. °3. If you need a refill on your pain medication, please contact your pharmacy.  They will contact our office to request authorization. Prescriptions will not be filled after 5 pm or on week-ends. °4. You should follow a light diet the first 24 hours after arrival home, such as soup and crackers, etc.  Be sure to include lots of fluids daily.  Resume your normal diet the day after surgery. °5. Most patients will experience some swelling and bruising around the umbilicus or in the groin and scrotum.  Ice packs and reclining will help.  Swelling and bruising can take several days to resolve.  °6. It is common to experience some constipation if taking pain medication after surgery.  Increasing fluid intake and taking a stool softener (such as Colace) will usually help or prevent this problem from occurring.  A mild laxative (Milk of Magnesia or Miralax) should be taken according to package directions if there are no bowel movements after 48 hours. °7. Unless discharge instructions indicate otherwise, you may remove your bandages 24-48 hours after surgery, and you may shower at that time.  You will have steri-strips (small skin tapes) in place directly over the incision.  These strips should be left on the skin for 7-10 days. °8. ACTIVITIES:  You may resume regular (light) daily activities  beginning the next day--such as daily self-care, walking, climbing stairs--gradually increasing activities as tolerated.  You may have sexual intercourse when it is comfortable.  Refrain from any heavy lifting or straining until approved by your doctor. °a. You may drive when you are no longer taking prescription pain medication, you can comfortably wear a seatbelt, and you can safely maneuver your car and apply brakes. °b. RETURN TO WORK:  2-3 weeks with light duty - no lifting over 15 lbs. °9. You should see your doctor in the office for a follow-up appointment approximately 2-3 weeks after your surgery.  Make sure that you call for this appointment within a day or two after you arrive home to insure a convenient appointment time. °10. OTHER INSTRUCTIONS:  __________________________________________________________________________________________________________________________________________________________________________________________  °WHEN TO CALL YOUR DOCTOR: °1. Fever over 101.0 °2. Inability to urinate °3. Nausea and/or vomiting °4. Extreme swelling or bruising °5. Continued bleeding from incision. °6. Increased pain, redness, or drainage from the incision ° °The clinic staff is available to answer your questions during regular business hours.  Please don’t hesitate to call and ask to speak to one of the nurses for clinical concerns.  If you have a medical emergency, go to the nearest emergency room or call 911.  A surgeon from [facility] is always on call at the hospital ° ° °[address], [address], [place], [place]  [address] ? ° [address] [address], [place], [place]   [address] °[phone]    [phone]    FAX [phone] °Web site:   [email or web address] ° ° °

## 2014-02-28 NOTE — H&P (View-Only) (Signed)
Patient ID: Vincent Singh, male   DOB: 05/14/1976, 37 y.o.   MRN: [id number]  Chief Complaint  Patient presents with  . Hernia    HPI Vincent Singh is a 38 y.o. male.  Referred by Teresa Anderson, NP for evaluation of RIH.   HPI This is a 38-year-old male who has mental retardation who resides in a group home. He is accompanied by his mother. Recently the patient one on a group outing where he went bowling. He began complaining of some pain in his right groin. He was evaluated in the emergency department on 01/26/14 and was noted to have a large reducible right inguinal hernia.  He has not really complained of any discomfort over the last few weeks but the patient is brought in by his mother for surgical evaluation. He denies any obstructive symptoms.   Past Medical History  Diagnosis Date  . Mental retardation     History reviewed. No pertinent past surgical history. Wisdom teeth extraction  History reviewed. No pertinent family history.  Social History History  Substance Use Topics  . Smoking status: Never Smoker   . Smokeless tobacco: Not on file  . Alcohol Use: No    No Known Allergies  Current Outpatient Prescriptions  Medication Sig Dispense Refill  . cholecalciferol (VITAMIN D) 1000 UNITS tablet Take 1,000 Units by mouth every morning.      . Multiple Vitamin (MULTIVITAMIN WITH MINERALS) TABS tablet Take 1 tablet by mouth every morning.       No current facility-administered medications for this visit.    Review of Systems Review of Systems  Constitutional: Negative for fever, chills and unexpected weight change.  HENT: Negative for congestion, hearing loss, sore throat, trouble swallowing and voice change.   Eyes: Negative for visual disturbance.  Respiratory: Negative for cough and wheezing.   Cardiovascular: Negative for chest pain, palpitations and leg swelling.  Gastrointestinal: Negative for nausea, vomiting, abdominal pain, diarrhea, constipation, blood in  stool, abdominal distention, anal bleeding and rectal pain.  Genitourinary: Positive for scrotal swelling and testicular pain. Negative for hematuria and difficulty urinating.  Musculoskeletal: Negative for arthralgias.  Skin: Negative for rash and wound.  Neurological: Negative for seizures, syncope, weakness and headaches.  Hematological: Negative for adenopathy. Does not bruise/bleed easily.  Psychiatric/Behavioral: Negative for confusion.     Physical Exam Physical Exam WDWN in NAD HEENT:  EOMI, sclera anicteric Neck:  No masses, no thyromegaly Lungs:  CTA bilaterally; normal respiratory effort CV:  Regular rate and rhythm; no murmurs Abd:  +bowel sounds, soft, non-tender, no masses GU:  Bilateral descended testes; no testicular masses; large partially reducible right inguinal hernia extending down into the scrotum. Ext:  Well-perfused; no edema Skin:  Warm, dry; no sign of jaundice  Data Reviewed none  Assessment    Large right inguinal hernia - partially reducible     Plan    Right inguinal hernia repair with mesh.  The surgical procedure has been discussed with the patient.  Potential risks, benefits, alternative treatments, and expected outcomes have been explained.  All of the patient's questions at this time have been answered.  The likelihood of reaching the patient's treatment goal is good.  The patient understand the proposed surgical procedure and wishes to proceed. The patient will go home with his mother for a few days after surgery before returning to the group home.         Vincent Singh K. 02/25/2014, 12:55 PM    

## 2014-02-28 NOTE — Interval H&P Note (Signed)
History and Physical Interval Note:  02/28/2014 10:45 AM  Vincent Singh  has presented today for surgery, with the diagnosis of right inguinal hernia   The various methods of treatment have been discussed with the patient and family. After consideration of risks, benefits and other options for treatment, the patient has consented to  Procedure(s): HERNIA REPAIR INGUINAL ADULT (Right) INSERTION OF MESH (Right) as a surgical intervention .  The patient's history has been reviewed, patient examined, no change in status, stable for surgery.  I have reviewed the patient's chart and labs.  Questions were answered to the patient's satisfaction.     Antasia Haider K.

## 2014-03-01 ENCOUNTER — Encounter (HOSPITAL_COMMUNITY): Payer: Self-pay | Admitting: Surgery

## 2014-03-18 ENCOUNTER — Encounter (INDEPENDENT_AMBULATORY_CARE_PROVIDER_SITE_OTHER): Payer: Self-pay | Admitting: Surgery

## 2014-03-18 ENCOUNTER — Ambulatory Visit (INDEPENDENT_AMBULATORY_CARE_PROVIDER_SITE_OTHER): Payer: Medicaid Other | Admitting: Surgery

## 2014-03-18 VITALS — BP 124/74 | HR 76 | Temp 98.4°F | Resp 14 | Ht 72.0 in | Wt 190.0 lb

## 2014-03-18 DIAGNOSIS — K4090 Unilateral inguinal hernia, without obstruction or gangrene, not specified as recurrent: Secondary | ICD-10-CM

## 2014-03-18 NOTE — Progress Notes (Signed)
Status post right inguinal hernia repair with mesh on 02/28/14 for a very large indirect hernia. The patient is accompanied by his father. The patient has no complaints. He never take any pain medication. He does have a lot of swelling in his right hemiscrotum. No sign of recurrent hernia. His incision is well-healed with no sign of infection. He likely had accumulation of seroma or hematoma in his right hemiscrotum. He denies any tenderness in this area. This should resolve over time. We will reevaluate him in 3-4 weeks to make sure that this is improving. He should limit his activity for 2 more weeks.  Wilmon ArmsMatthew K. Corliss Skainssuei, MD, [facility]  General/ Trauma Surgery  03/18/2014 12:27 PM

## 2014-04-15 ENCOUNTER — Encounter (INDEPENDENT_AMBULATORY_CARE_PROVIDER_SITE_OTHER): Payer: Self-pay | Admitting: Surgery

## 2014-04-15 ENCOUNTER — Ambulatory Visit (INDEPENDENT_AMBULATORY_CARE_PROVIDER_SITE_OTHER): Payer: Medicaid Other | Admitting: Surgery

## 2014-04-15 VITALS — BP 122/70 | HR 72 | Temp 98.2°F | Resp 18 | Ht 73.0 in | Wt 192.4 lb

## 2014-04-15 DIAGNOSIS — K4090 Unilateral inguinal hernia, without obstruction or gangrene, not specified as recurrent: Secondary | ICD-10-CM

## 2014-04-15 NOTE — Progress Notes (Signed)
Status post right inguinal hernia repair with mesh on 02/28/14. At his initial postop visit he has significant swelling in his right hemiscrotum. He remained swollen but has decreased significantly in size. The incision looks good with no sign of infection or hematoma. No sign of recurrent hernia. The patient may resume full duty. Followup as needed.  Wilmon ArmsMatthew K. Corliss Skainssuei, MD, [facility]  General/ Trauma Surgery  04/15/2014 9:51 AM

## 2019-10-15 ENCOUNTER — Other Ambulatory Visit: Payer: Self-pay

## 2019-10-15 DIAGNOSIS — Z20822 Contact with and (suspected) exposure to covid-19: Secondary | ICD-10-CM

## 2019-10-17 LAB — NOVEL CORONAVIRUS, NAA: SARS-CoV-2, NAA: NOT DETECTED

## 2020-09-29 ENCOUNTER — Other Ambulatory Visit: Payer: Self-pay

## 2020-09-30 ENCOUNTER — Emergency Department (HOSPITAL_COMMUNITY): Payer: Medicaid Other

## 2020-09-30 ENCOUNTER — Encounter (HOSPITAL_COMMUNITY): Payer: Self-pay | Admitting: Emergency Medicine

## 2020-09-30 ENCOUNTER — Other Ambulatory Visit: Payer: Self-pay

## 2020-09-30 ENCOUNTER — Inpatient Hospital Stay (HOSPITAL_COMMUNITY)
Admission: EM | Admit: 2020-09-30 | Discharge: 2020-10-04 | DRG: 394 | Disposition: A | Discharge status: Home / Self Care | Payer: Medicaid Other | Attending: Internal Medicine | Admitting: Internal Medicine

## 2020-09-30 DIAGNOSIS — F419 Anxiety disorder, unspecified: Secondary | ICD-10-CM | POA: Diagnosis present

## 2020-09-30 DIAGNOSIS — E739 Lactose intolerance, unspecified: Secondary | ICD-10-CM | POA: Diagnosis present

## 2020-09-30 DIAGNOSIS — E860 Dehydration: Secondary | ICD-10-CM | POA: Diagnosis present

## 2020-09-30 DIAGNOSIS — E876 Hypokalemia: Secondary | ICD-10-CM | POA: Diagnosis present

## 2020-09-30 DIAGNOSIS — K56609 Unspecified intestinal obstruction, unspecified as to partial versus complete obstruction: Secondary | ICD-10-CM

## 2020-09-30 DIAGNOSIS — N179 Acute kidney failure, unspecified: Secondary | ICD-10-CM

## 2020-09-30 DIAGNOSIS — K760 Fatty (change of) liver, not elsewhere classified: Secondary | ICD-10-CM | POA: Diagnosis present

## 2020-09-30 DIAGNOSIS — K4030 Unilateral inguinal hernia, with obstruction, without gangrene, not specified as recurrent: Secondary | ICD-10-CM | POA: Diagnosis present

## 2020-09-30 DIAGNOSIS — F79 Unspecified intellectual disabilities: Secondary | ICD-10-CM | POA: Diagnosis present

## 2020-09-30 DIAGNOSIS — K460 Unspecified abdominal hernia with obstruction, without gangrene: Secondary | ICD-10-CM | POA: Diagnosis present

## 2020-09-30 DIAGNOSIS — Z20822 Contact with and (suspected) exposure to covid-19: Secondary | ICD-10-CM | POA: Diagnosis present

## 2020-09-30 DIAGNOSIS — K4031 Unilateral inguinal hernia, with obstruction, without gangrene, recurrent: Principal | ICD-10-CM | POA: Diagnosis present

## 2020-09-30 DIAGNOSIS — E871 Hypo-osmolality and hyponatremia: Secondary | ICD-10-CM | POA: Diagnosis present

## 2020-09-30 DIAGNOSIS — R7301 Impaired fasting glucose: Secondary | ICD-10-CM | POA: Diagnosis present

## 2020-09-30 DIAGNOSIS — J392 Other diseases of pharynx: Secondary | ICD-10-CM | POA: Diagnosis present

## 2020-09-30 LAB — CBC
HCT: 57.3 % — ABNORMAL HIGH (ref 39.0–52.0)
Hemoglobin: 20.5 g/dL — ABNORMAL HIGH (ref 13.0–17.0)
MCH: 29 pg (ref 26.0–34.0)
MCHC: 35.8 g/dL (ref 30.0–36.0)
MCV: 81 fL (ref 80.0–100.0)
Platelets: 265 10*3/uL (ref 150–400)
RBC: 7.07 MIL/uL — ABNORMAL HIGH (ref 4.22–5.81)
RDW: 13 % (ref 11.5–15.5)
WBC: 9.1 10*3/uL (ref 4.0–10.5)
nRBC: 0 % (ref 0.0–0.2)

## 2020-09-30 LAB — COMPREHENSIVE METABOLIC PANEL
ALT: 153 U/L — ABNORMAL HIGH (ref 0–44)
AST: 47 U/L — ABNORMAL HIGH (ref 15–41)
Albumin: 4.8 g/dL (ref 3.5–5.0)
Alkaline Phosphatase: 56 U/L (ref 38–126)
Anion gap: 21 — ABNORMAL HIGH (ref 5–15)
BUN: 80 mg/dL — ABNORMAL HIGH (ref 6–20)
CO2: 21 mmol/L — ABNORMAL LOW (ref 22–32)
Calcium: 9.3 mg/dL (ref 8.9–10.3)
Chloride: 84 mmol/L — ABNORMAL LOW (ref 98–111)
Creatinine, Ser: 2.88 mg/dL — ABNORMAL HIGH (ref 0.61–1.24)
GFR calc non Af Amer: 25 mL/min — ABNORMAL LOW (ref >60)
Glucose, Bld: 160 mg/dL — ABNORMAL HIGH (ref 70–99)
Potassium: 3.6 mmol/L (ref 3.5–5.1)
Sodium: 126 mmol/L — ABNORMAL LOW (ref 135–145)
Total Bilirubin: 1.7 mg/dL — ABNORMAL HIGH (ref 0.3–1.2)
Total Protein: 8.7 g/dL — ABNORMAL HIGH (ref 6.5–8.1)

## 2020-09-30 LAB — RESPIRATORY PANEL BY RT PCR (FLU A&B, COVID)
Influenza A by PCR: NEGATIVE
Influenza B by PCR: NEGATIVE
SARS Coronavirus 2 by RT PCR: NEGATIVE

## 2020-09-30 LAB — LIPASE, BLOOD: Lipase: 21 U/L (ref 11–51)

## 2020-09-30 MED ORDER — PROMETHAZINE HCL 25 MG/ML IJ SOLN
12.5000 mg | Freq: Four times a day (QID) | INTRAMUSCULAR | Status: DC | PRN
  Administered 2020-10-01: 12.5 mg via INTRAVENOUS
  Filled 2020-09-30: qty 1

## 2020-09-30 MED ORDER — SODIUM CHLORIDE 0.9 % IV BOLUS
1000.0000 mL | Freq: Once | INTRAVENOUS | Status: AC
  Administered 2020-09-30: 1000 mL via INTRAVENOUS

## 2020-09-30 MED ORDER — PANTOPRAZOLE SODIUM 40 MG IV SOLR
40.0000 mg | Freq: Once | INTRAVENOUS | Status: AC
  Administered 2020-09-30: 40 mg via INTRAVENOUS
  Filled 2020-09-30: qty 40

## 2020-09-30 MED ORDER — PROMETHAZINE HCL 25 MG/ML IJ SOLN
12.5000 mg | Freq: Once | INTRAMUSCULAR | Status: AC
  Administered 2020-09-30: 12.5 mg via INTRAVENOUS
  Filled 2020-09-30: qty 1

## 2020-09-30 MED ORDER — SODIUM CHLORIDE 0.9 % IV SOLN: INTRAVENOUS | Status: DC

## 2020-09-30 MED ORDER — ONDANSETRON HCL 4 MG/2ML IJ SOLN
4.0000 mg | Freq: Four times a day (QID) | INTRAMUSCULAR | Status: DC | PRN
  Administered 2020-10-02: 4 mg via INTRAVENOUS
  Filled 2020-09-30: qty 2

## 2020-09-30 MED ORDER — SODIUM CHLORIDE 0.9 % IV SOLN
8.0000 mg | Freq: Once | INTRAVENOUS | Status: AC
  Administered 2020-09-30: 8 mg via INTRAVENOUS
  Filled 2020-09-30: qty 4

## 2020-09-30 NOTE — Consult Note (Signed)
Surgical Evaluation Requesting provider: Army Melia Singh  Chief Complaint: vomiting  HPI: 44 year old gentleman with history of mental disorder living in a group home, and prior open right inguinal hernia repair (direct, ProGrip mesh, Vincent Singh 2015) who presents to the [facility], ER with frequent emesis episodes beginning 3 days ago and inability to tolerate p.o.  The patient denies abdominal pain.  Vomiting was refractory to Zofran.  Denies fevers or chills.  Unknown last bowel movement.  On initial evaluation he was noted to be hyponatremic with acute kidney injury with a creatinine of 2.88 and clearly dehydrated.  A CT scan has demonstrated a small bowel obstruction secondary to an incarcerated recurrent right inguinal hernia.  No Known Allergies  Past Medical History:  Diagnosis Date   Anxiety    ANXIETY OVER SURGERY AND NEEDLES- TALKS A LOT WHEN ANXIOUS   Complication of anesthesia    SOME AGGITATION WAKING UP AFTER WISDOM TEETH EXTRACTED   Hyperactive gag reflex    DOES NOT TOLERATE ORAL THERMOMETER   Inguinal hernia    RIGHT INGUINAL HERNIA - CAUSED PAIN ABOUT 3 WEEKS    Mental disorder 02/27/14   MENTALLY CHALLANGED-LIVES IN GROUP HOME - ALERT - ABLE TO EXPRESS NEEDS AND PERFORMS HIS ACTIVITES OF DAILY LIVING INDEPENDENTLY. HIS MOTHER / HCPOA  Vincent Singh WITH HIM TODAY AT [facility] PREOP AND ANSWERED MEDICAL HX QUESTIONS.    Past Surgical History:  Procedure Laterality Date   HERNIA REPAIR     INGUINAL HERNIA REPAIR Right 02/28/2014   Procedure: HERNIA REPAIR INGUINAL ADULT;  Surgeon: Vincent Singh;  Location: [facility];  Service: General;  Laterality: Right;   INSERTION OF MESH Right 02/28/2014   Procedure: INSERTION OF MESH;  Surgeon: Vincent Singh;  Location: [facility];  Service: General;  Laterality: Right;   WISDOM TEETH EXTRACTIONS - 20 YRS AGO      History reviewed. No pertinent family history.  Social History   Socioeconomic History   Marital status:  Single    Spouse name: Not on file   Number of children: Not on file   Years of education: Not on file   Highest education level: Not on file  Occupational History   Not on file  Tobacco Use   Smoking status: Never Smoker   Smokeless tobacco: Never Used  Substance and Sexual Activity   Alcohol use: No   Drug use: No   Sexual activity: Never  Other Topics Concern   Not on file  Social History Narrative   Not on file   Social Determinants of Health   Financial Resource Strain:    Difficulty of Paying Living Expenses: Not on file  Food Insecurity:    Worried About Running Out of Food in the Last Year: Not on file   Ran Out of Food in the Last Year: Not on file  Transportation Needs:    Lack of Transportation (Medical): Not on file   Lack of Transportation (Non-Medical): Not on file  Physical Activity:    Days of Exercise per Week: Not on file   Minutes of Exercise per Session: Not on file  Stress:    Feeling of Stress : Not on file  Social Connections:    Frequency of Communication with Friends and Family: Not on file   Frequency of Social Gatherings with Friends and Family: Not on file   Attends Religious Services: Not on file   Active Member of Clubs or Organizations: Not on file  Attends Banker Meetings: Not on file   Marital Status: Not on file    No current facility-administered medications on file prior to encounter.   Current Outpatient Medications on File Prior to Encounter  Medication Sig Dispense Refill   ergocalciferol (VITAMIN D2) 1.25 MG (50000 UT) capsule Take 50,000 Units by mouth once a week. Tuesdays     Multiple Vitamin (MULTIVITAMIN WITH MINERALS) TABS tablet Take 1 tablet by mouth daily.      ondansetron (ZOFRAN-ODT) 4 MG disintegrating tablet Take 4 mg by mouth every 8 (eight) hours as needed for nausea or vomiting.      Review of Systems: a complete, 10pt review of systems was completed with pertinent  positives and negatives as documented in the HPI  Physical Exam: Vitals:   09/30/20 1930 09/30/20 2022  BP: (!) 133/93 122/80  Pulse: (!) 110 (!) 119  Resp: 17 17  Temp:    SpO2: 99% 100%   Gen: Alert and cooperative, no distress, but hiccuping frequently and one episode of small-volume emesis while I was in the room. Eyes: lids and conjunctivae normal, no icterus. Pupils equally round and reactive to light.  Neck: supple without mass or thyromegaly Chest: respiratory effort is normal. No crepitus or tenderness on palpation of the chest. Breath sounds equal.  Cardiovascular: Tachycardic to 108, regular, palpable distal pulses, no pedal edema Gastrointestinal: soft, minimally distended, nontender. No mass, hepatomegaly or splenomegaly.  There is a recurrent right inguinal hernia which is nontender, no overlying skin changes.  This was able to be reduced after a couple minutes of gentle pressure. Lymphatic: no lymphadenopathy in the neck or groin Muscoloskeletal: no clubbing or cyanosis of the fingers.  Strength is symmetrical throughout.  Range of motion of bilateral upper and lower extremities normal without pain, crepitation or contracture. Neuro: cranial nerves grossly intact.  Sensation intact to light touch diffusely. Psych: Baseline mood and affect per caregiver, insight/judgment limited Skin: warm and dry   CBC Latest Ref Rng & Units 09/30/2020  WBC 4.0 - 10.5 K/uL 9.1  Hemoglobin 13.0 - 17.0 g/dL 20.5(H)  Hematocrit 39 - 52 % 57.3(H)  Platelets 150 - 400 K/uL 265    CMP Latest Ref Rng & Units 09/30/2020  Glucose 70 - 99 mg/dL 209(O)  BUN 6 - 20 mg/dL 70(J)  Creatinine 6.28 - 1.24 mg/dL 3.66(Q)  Sodium 947 - 654 mmol/L 126(L)  Potassium 3.5 - 5.1 mmol/L 3.6  Chloride 98 - 111 mmol/L 84(L)  CO2 22 - 32 mmol/L 21(L)  Calcium 8.9 - 10.3 mg/dL 9.3  Total Protein 6.5 - 8.1 g/dL 6.5(K)  Total Bilirubin 0.3 - 1.2 mg/dL 3.5(W)  Alkaline Phos 38 - 126 U/L 56  AST 15 - 41 U/L  47(H)  ALT 0 - 44 U/L 153(H)    No results found for: INR, PROTIME  Imaging: CT Abdomen Pelvis Wo Contrast  Result Date: 09/30/2020 CLINICAL DATA:  Nausea and vomiting EXAM: CT ABDOMEN AND PELVIS WITHOUT CONTRAST TECHNIQUE: Multidetector CT imaging of the abdomen and pelvis was performed following the standard protocol without IV contrast. COMPARISON:  Ultrasound from earlier in the same day. FINDINGS: Lower chest: No acute abnormality. Hepatobiliary: Liver is within normal limits. Gallbladder is contracted. Known gallstones are not well appreciated on this exam. Pancreas: Unremarkable. No pancreatic ductal dilatation or surrounding inflammatory changes. Spleen: Normal in size without focal abnormality. Adrenals/Urinary Tract: Adrenal glands are within normal limits. Kidneys demonstrate no renal calculi or urinary tract obstructive changes. Stomach/Bowel:  Scattered diverticular change of the colon is noted. No evidence of diverticulitis is seen. The appendix is within normal limits. Terminal ileum is unremarkable. In the mid to distal ileum there is evidence of a right inguinal hernia containing a loop of distal small bowel causing proximal small bowel obstruction. Fluid is noted within the distal esophagus consistent with reflux. The stomach is otherwise within normal limits. Vascular/Lymphatic: No significant vascular findings are present. No enlarged abdominal or pelvic lymph nodes. Reproductive: Prostate is unremarkable. Other: No abdominal ascites is seen. Right inguinal hernia is noted as described above containing a loop of incarcerated small bowel causing proximal small-bowel obstruction involving the jejunum and proximal ileum. Musculoskeletal: No acute or significant osseous findings. IMPRESSION: Small-bowel obstruction in the mid to distal ileum secondary to an incarcerated right inguinal hernia. Previously seen cholelithiasis is not well appreciated. No other focal abnormality is noted.  Electronically Signed   By: Alcide Clever M.D.   On: 09/30/2020 20:25   DG Abdomen 1 View  Result Date: 09/30/2020 CLINICAL DATA:  Constipation vomiting EXAM: ABDOMEN - 1 VIEW COMPARISON:  None. FINDINGS: Dilated small bowel loops in the central abdomen up to 4.6 cm. Some scattered colon gas but relative paucity. No radiopaque calculi. IMPRESSION: Dilated small bowel loops in the central abdomen, appearance is concerning for small bowel obstruction. Electronically Signed   By: Jasmine Pang M.D.   On: 09/30/2020 19:24   US Abdomen Limited  Result Date: 09/30/2020 CLINICAL DATA:  Right upper quadrant pain with elevated LFTs EXAM: ULTRASOUND ABDOMEN LIMITED RIGHT UPPER QUADRANT COMPARISON:  None. FINDINGS: Gallbladder: Decompressed with multiple gallstones within. No pericholecystic fluid is noted. Negative sonographic Murphy's sign is elicited. Common bile duct: Diameter: 3 mm. Liver: Mild increased echogenicity is noted consistent with fatty infiltration. No focal mass is seen. Portal vein is patent on color Doppler imaging with normal direction of blood flow towards the liver. Other: None. IMPRESSION: Decompressed gallbladder with multiple gallstones within. Fatty liver. No acute abnormality noted. Electronically Signed   By: Alcide Clever M.D.   On: 09/30/2020 20:00     A/P: 44 year old gentleman with small bowel obstruction secondary to an incarcerated recurrent right inguinal hernia, which has now been reduced.  Hopefully this will allow the obstruction to improve and the bowel to decompress.  He reportedly cannot tolerate/refuses NG tube, but if the emesis persists despite the hernia being reduced an NG tube would certainly improve his comfort.  I discussed with the patient as well as with his mother by phone that I would recommend proceeding with repair of the recurrent hernia in order to prevent recurrence of the incarceration; as long as the hernia remains reducible and the obstruction resolves it  is not mandatory to do that while admitted but I would recommend considering it.  For this evening would keep strictly n.p.o., fluid resuscitate, repeat labs in the morning to ensure improvement in creatinine, will order plain films for the morning.    Patient Active Problem List   Diagnosis Date Noted   Right inguinal hernia 02/25/2014       Phylliss Blakes, Singh [facility], [place]  See AMION to contact appropriate on-call provider

## 2020-09-30 NOTE — ED Notes (Signed)
To to ultra Sound and then Xray.

## 2020-09-30 NOTE — ED Provider Notes (Signed)
[facility] Provider Note   CSN: [id number] Arrival date & time: 09/30/20  1426     History Chief Complaint  Patient presents with   Emesis   Nausea    Vincent Singh is a 44 y.o. male.  44 year old male brought in by group home staff for vomiting since early Saturday morning.  Staff reports patient has vomited at least 10 times in the 4 hours he has been in the lobby waiting.  Staff has tried to give patient Zofran at the facility without improvement.  Patient was seen in urgent care yesterday, had a negative Covid test.  Patient denies fevers, chills, abdominal pain.  Last bowel movement is unknown.  Patient denies any significant medical history, takes a multivitamin daily.        Past Medical History:  Diagnosis Date   Anxiety    ANXIETY OVER SURGERY AND NEEDLES- TALKS A LOT WHEN ANXIOUS   Complication of anesthesia    SOME AGGITATION WAKING UP AFTER WISDOM TEETH EXTRACTED   Hyperactive gag reflex    DOES NOT TOLERATE ORAL THERMOMETER   Inguinal hernia    RIGHT INGUINAL HERNIA - CAUSED PAIN ABOUT 3 WEEKS    Mental disorder 02/27/14   MENTALLY CHALLANGED-LIVES IN GROUP HOME - ALERT - ABLE TO EXPRESS NEEDS AND PERFORMS HIS ACTIVITES OF DAILY LIVING INDEPENDENTLY. HIS MOTHER / HCPOA  JANE Nedd [facility] AND ANSWERED MEDICAL HX QUESTIONS.    Patient Active Problem List   Diagnosis Date Noted   Right inguinal hernia 02/25/2014    Past Surgical History:  Procedure Laterality Date   HERNIA REPAIR     INGUINAL HERNIA REPAIR Right 02/28/2014   Procedure: HERNIA REPAIR INGUINAL ADULT;  Surgeon: Wilmon ArmsMatthew K. Corliss Skainssuei, MD;  Location: [facility];  Service: General;  Laterality: Right;   INSERTION OF MESH Right 02/28/2014   Procedure: INSERTION OF MESH;  Surgeon: Wilmon ArmsMatthew K. Corliss Skainssuei, MD;  Location: [facility];  Service: General;  Laterality: Right;   WISDOM TEETH EXTRACTIONS - 20 YRS AGO         History reviewed. No  pertinent family history.  Social History   Tobacco Use   Smoking status: Never Smoker   Smokeless tobacco: Never Used  Substance Use Topics   Alcohol use: No   Drug use: No    Home Medications Prior to Admission medications   Medication Sig Start Date End Date Taking? Authorizing Provider  ergocalciferol (VITAMIN D2) 1.25 MG (50000 UT) capsule Take 50,000 Units by mouth once a week. Tuesdays   Yes [provider]  Multiple Vitamin (MULTIVITAMIN WITH MINERALS) TABS tablet Take 1 tablet by mouth daily.    Yes [provider]  ondansetron (ZOFRAN-ODT) 4 MG disintegrating tablet Take 4 mg by mouth every 8 (eight) hours as needed for nausea or vomiting.   Yes [provider]    Allergies    Patient has no known allergies.  Review of Systems   Review of Systems  Constitutional: Negative for chills and fever.  Respiratory: Negative for shortness of breath.   Cardiovascular: Negative for chest pain.  Gastrointestinal: Positive for constipation, nausea and vomiting. Negative for abdominal pain and diarrhea.  Musculoskeletal: Negative for arthralgias and myalgias.  Skin: Negative for rash and wound.  Allergic/Immunologic: Negative for immunocompromised state.  Neurological: Negative for dizziness, weakness and headaches.  Psychiatric/Behavioral: Negative for confusion.  All other systems reviewed and are negative.   Physical Exam Updated Vital Signs  BP (!) 128/91    Pulse (!) 109    Temp 97.7 F (36.5 C) (Axillary)    Resp 17    Ht 6\' 3"  (1.905 m)    Wt 85.3 kg    SpO2 97%    BMI 23.50 kg/m   Physical Exam Vitals and nursing note reviewed.  Constitutional:      General: He is not in acute distress.    Appearance: He is well-developed. He is not diaphoretic.  HENT:     Head: Normocephalic and atraumatic.     Mouth/Throat:     Mouth: Mucous membranes are dry.  Cardiovascular:     Rate and Rhythm: Regular rhythm. Tachycardia present.      Pulses: Normal pulses.  Pulmonary:     Effort: Pulmonary effort is normal.     Breath sounds: Normal breath sounds.  Abdominal:     Palpations: Abdomen is soft.     Tenderness: There is no abdominal tenderness.     Hernia: A hernia is present. Hernia is present in the right inguinal area.     Comments: Non tender right inguinal hernia  Musculoskeletal:     Right lower leg: No edema.     Left lower leg: No edema.  Skin:    General: Skin is warm and dry.     Findings: No erythema or rash.  Neurological:     Mental Status: He is alert and oriented to person, place, and time.  Psychiatric:        Behavior: Behavior normal.     ED Results / Procedures / Treatments   Labs (all labs ordered are listed, but only abnormal results are displayed) Labs Reviewed  COMPREHENSIVE METABOLIC PANEL - Abnormal; Notable for the following components:      Result Value   Sodium 126 (*)    Chloride 84 (*)    CO2 21 (*)    Glucose, Bld 160 (*)    BUN 80 (*)    Creatinine, Ser 2.88 (*)    Total Protein 8.7 (*)    AST 47 (*)    ALT 153 (*)    Total Bilirubin 1.7 (*)    GFR calc non Af Amer 25 (*)    Anion gap 21 (*)    All other components within normal limits  CBC - Abnormal; Notable for the following components:   RBC 7.07 (*)    Hemoglobin 20.5 (*)    HCT 57.3 (*)    All other components within normal limits  RESPIRATORY PANEL BY RT PCR (FLU A&B, COVID)  LIPASE, BLOOD  URINALYSIS, ROUTINE W REFLEX MICROSCOPIC  PH, GASTRIC FLUID (GASTROCCULT CARD)  CBC  BASIC METABOLIC PANEL    EKG None  Radiology CT Abdomen Pelvis Wo Contrast  Result Date: 09/30/2020 CLINICAL DATA:  Nausea and vomiting EXAM: CT ABDOMEN AND PELVIS WITHOUT CONTRAST TECHNIQUE: Multidetector CT imaging of the abdomen and pelvis was performed following the standard protocol without IV contrast. COMPARISON:  Ultrasound from earlier in the same day. FINDINGS: Lower chest: No acute abnormality. Hepatobiliary: Liver is  within normal limits. Gallbladder is contracted. Known gallstones are not well appreciated on this exam. Pancreas: Unremarkable. No pancreatic ductal dilatation or surrounding inflammatory changes. Spleen: Normal in size without focal abnormality. Adrenals/Urinary Tract: Adrenal glands are within normal limits. Kidneys demonstrate no renal calculi or urinary tract obstructive changes. Stomach/Bowel: Scattered diverticular change of the colon is noted. No evidence of diverticulitis is seen. The appendix is within normal limits. Terminal  ileum is unremarkable. In the mid to distal ileum there is evidence of a right inguinal hernia containing a loop of distal small bowel causing proximal small bowel obstruction. Fluid is noted within the distal esophagus consistent with reflux. The stomach is otherwise within normal limits. Vascular/Lymphatic: No significant vascular findings are present. No enlarged abdominal or pelvic lymph nodes. Reproductive: Prostate is unremarkable. Other: No abdominal ascites is seen. Right inguinal hernia is noted as described above containing a loop of incarcerated small bowel causing proximal small-bowel obstruction involving the jejunum and proximal ileum. Musculoskeletal: No acute or significant osseous findings. IMPRESSION: Small-bowel obstruction in the mid to distal ileum secondary to an incarcerated right inguinal hernia. Previously seen cholelithiasis is not well appreciated. No other focal abnormality is noted. Electronically Signed   By: Alcide Clever M.D.   On: 09/30/2020 20:25   DG Abdomen 1 View  Result Date: 09/30/2020 CLINICAL DATA:  Constipation vomiting EXAM: ABDOMEN - 1 VIEW COMPARISON:  None. FINDINGS: Dilated small bowel loops in the central abdomen up to 4.6 cm. Some scattered colon gas but relative paucity. No radiopaque calculi. IMPRESSION: Dilated small bowel loops in the central abdomen, appearance is concerning for small bowel obstruction. Electronically Signed    By: Jasmine Pang M.D.   On: 09/30/2020 19:24   US Abdomen Limited  Result Date: 09/30/2020 CLINICAL DATA:  Right upper quadrant pain with elevated LFTs EXAM: ULTRASOUND ABDOMEN LIMITED RIGHT UPPER QUADRANT COMPARISON:  None. FINDINGS: Gallbladder: Decompressed with multiple gallstones within. No pericholecystic fluid is noted. Negative sonographic Deneka Greenwalt's sign is elicited. Common bile duct: Diameter: 3 mm. Liver: Mild increased echogenicity is noted consistent with fatty infiltration. No focal mass is seen. Portal vein is patent on color Doppler imaging with normal direction of blood flow towards the liver. Other: None. IMPRESSION: Decompressed gallbladder with multiple gallstones within. Fatty liver. No acute abnormality noted. Electronically Signed   By: Alcide Clever M.D.   On: 09/30/2020 20:00    Procedures .Critical Care Performed by: Jeannie Fend, PA-C Authorized by: Jeannie Fend, PA-C   Critical care provider statement:    Critical care time (minutes):  45   Critical care was time spent personally by me on the following activities:  Discussions with consultants, evaluation of patient's response to treatment, examination of patient, ordering and performing treatments and interventions, ordering and review of laboratory studies, ordering and review of radiographic studies, pulse oximetry, re-evaluation of patient's condition, obtaining history from patient or surrogate and review of old charts   (including critical care time)  Medications Ordered in ED Medications  0.9 %  sodium chloride infusion ( Intravenous New Bag/Given 09/30/20 2207)  promethazine (PHENERGAN) injection 12.5 mg (has no administration in time range)  ondansetron (ZOFRAN) injection 4 mg (has no administration in time range)  pantoprazole (PROTONIX) injection 40 mg (40 mg Intravenous Given 09/30/20 1904)  sodium chloride 0.9 % bolus 1,000 mL (0 mLs Intravenous Stopped 09/30/20 2207)  promethazine (PHENERGAN)  injection 12.5 mg (12.5 mg Intravenous Given 09/30/20 1907)  sodium chloride 0.9 % bolus 1,000 mL (0 mLs Intravenous Stopped 09/30/20 2024)  ondansetron (ZOFRAN) 8 mg in sodium chloride 0.9 % 50 mL IVPB (0 mg Intravenous Stopped 09/30/20 2156)    ED Course  I have reviewed the triage vital signs and the nursing notes.  Pertinent labs & imaging results that were available during my care of the patient were reviewed by me and considered in my medical decision making (see chart for details).  Clinical Course as of Sep 30 2233  Tue Sep 30, 2020  6463 44 year old male from group home (history of mental disorder), vomiting onset early Saturday morning. No complaints of abdominal pain, unknown last bowel movement.  On exam, no abdominal tenderness, does have right inguinal hernia (prior repair in 2015 with mesh). Labs show AKI with Cr 2.88, Co2 21 with gap 21 (non diabetic, glucose 160). Patient was given IV fluids, vomiting not controlled with zofran or phenergan.  XR with concern for SBO, followed by CT without contrast due to AKI. CT shows small bowel obstruction in the mid to distal ileum secondary to an incarcerated right inguinal hernia.  [place] ordered for vomiting with elevated LFTs, gallstones without acute cholecystitis.  Case discussed with Dr. Rodena Medin, ER attending, patient had to be restrained for COVID testing, will not tolerate NG tube. Attempted to reduce hernia however not well tolerated secondary to vomiting while lying supine.   Consult to Dr. Fredricka Bonine with general surgery who will come and see the patient.    [LM]  2104 Dr. Fredricka Bonine was able to reduce the hernia at the bedside, plan is to admit to hospitalist for AKI, surgery tomorrow, no NG tube at this time.   [LM]  2233 Case discussed with Dr. Toniann Fail who will consult for admission.   [LM]    Clinical Course User Index [LM] Alden Hipp   MDM Rules/Calculators/A&P                          Final Clinical Impression(s)  / ED Diagnoses Final diagnoses:  Small bowel obstruction [facility])  Incarcerated right inguinal hernia  AKI (acute kidney injury) (HCC)  Hyponatremia    Rx / DC Orders ED Discharge Orders    None       Jeannie Fend, PA-C 09/30/20 2234    Wynetta Fines, MD 10/03/20 [phone]

## 2020-09-30 NOTE — ED Notes (Signed)
RN walked to ultra sound to give a urinal, found that pt had 1 significant episode of emesis.  RN assisted in cleaning up pt.

## 2020-09-30 NOTE — ED Triage Notes (Signed)
Patient arrives to ED with complaints of nausea and vomiting since Saturday morning. Pt states he has vomited x4 times a day and has not been able to eat or drink. Per pt the vomit has a reddish tint to it. Pt denies abdominal pain but states he has not had a BM since Friday.

## 2020-09-30 NOTE — ED Notes (Signed)
Pt having short runs of vtac.  ED provider aware.  Pt has also started to have emesis again, which had resolved for about 15 minutes.

## 2020-09-30 NOTE — ED Notes (Signed)
Pt to CT

## 2020-10-01 ENCOUNTER — Inpatient Hospital Stay (HOSPITAL_COMMUNITY): Payer: Medicaid Other

## 2020-10-01 ENCOUNTER — Encounter (HOSPITAL_COMMUNITY): Payer: Self-pay | Admitting: Internal Medicine

## 2020-10-01 DIAGNOSIS — K4030 Unilateral inguinal hernia, with obstruction, without gangrene, not specified as recurrent: Secondary | ICD-10-CM

## 2020-10-01 DIAGNOSIS — N179 Acute kidney failure, unspecified: Secondary | ICD-10-CM

## 2020-10-01 DIAGNOSIS — K460 Unspecified abdominal hernia with obstruction, without gangrene: Secondary | ICD-10-CM | POA: Diagnosis present

## 2020-10-01 DIAGNOSIS — K56609 Unspecified intestinal obstruction, unspecified as to partial versus complete obstruction: Secondary | ICD-10-CM

## 2020-10-01 HISTORY — DX: Unspecified intestinal obstruction, unspecified as to partial versus complete obstruction: K56.609

## 2020-10-01 LAB — HEPATITIS PANEL, ACUTE
HCV Ab: NONREACTIVE
Hep A IgM: NONREACTIVE
Hep B C IgM: NONREACTIVE
Hepatitis B Surface Ag: NONREACTIVE

## 2020-10-01 LAB — BASIC METABOLIC PANEL
Anion gap: 14 (ref 5–15)
BUN: 63 mg/dL — ABNORMAL HIGH (ref 6–20)
CO2: 22 mmol/L (ref 22–32)
Calcium: 7.7 mg/dL — ABNORMAL LOW (ref 8.9–10.3)
Chloride: 93 mmol/L — ABNORMAL LOW (ref 98–111)
Creatinine, Ser: 1.71 mg/dL — ABNORMAL HIGH (ref 0.61–1.24)
GFR calc non Af Amer: 47 mL/min — ABNORMAL LOW (ref >60)
Glucose, Bld: 124 mg/dL — ABNORMAL HIGH (ref 70–99)
Potassium: 3.4 mmol/L — ABNORMAL LOW (ref 3.5–5.1)
Sodium: 129 mmol/L — ABNORMAL LOW (ref 135–145)

## 2020-10-01 LAB — URINALYSIS, ROUTINE W REFLEX MICROSCOPIC
Bacteria, UA: NONE SEEN
Bilirubin Urine: NEGATIVE
Glucose, UA: NEGATIVE mg/dL
Ketones, ur: NEGATIVE mg/dL
Leukocytes,Ua: NEGATIVE
Nitrite: NEGATIVE
Protein, ur: NEGATIVE mg/dL
Specific Gravity, Urine: 1.018 (ref 1.005–1.030)
pH: 5 (ref 5.0–8.0)

## 2020-10-01 LAB — CBC
HCT: 47.3 % (ref 39.0–52.0)
Hemoglobin: 16.7 g/dL (ref 13.0–17.0)
MCH: 28.5 pg (ref 26.0–34.0)
MCHC: 35.3 g/dL (ref 30.0–36.0)
MCV: 80.9 fL (ref 80.0–100.0)
Platelets: 190 10*3/uL (ref 150–400)
RBC: 5.85 MIL/uL — ABNORMAL HIGH (ref 4.22–5.81)
RDW: 12.2 % (ref 11.5–15.5)
WBC: 8.3 10*3/uL (ref 4.0–10.5)
nRBC: 0 % (ref 0.0–0.2)

## 2020-10-01 LAB — HEMOGLOBIN A1C
Hgb A1c MFr Bld: 5 % (ref 4.8–5.6)
Mean Plasma Glucose: 96.8 mg/dL

## 2020-10-01 LAB — HIV ANTIBODY (ROUTINE TESTING W REFLEX): HIV Screen 4th Generation wRfx: NONREACTIVE

## 2020-10-01 MED ORDER — ONDANSETRON HCL 4 MG PO TABS: 4.0000 mg | ORAL_TABLET | Freq: Four times a day (QID) | ORAL | Status: DC | PRN

## 2020-10-01 MED ORDER — ONDANSETRON HCL 4 MG/2ML IJ SOLN: 4.0000 mg | Freq: Four times a day (QID) | INTRAMUSCULAR | Status: DC | PRN

## 2020-10-01 NOTE — TOC Initial Note (Signed)
Transition of Care [facility]) - Initial/Assessment Note    Patient Details  Name: Vincent Singh MRN: [id number] Date of Birth: [birth year]  [facility] CM/SW Contact:    Vincent Hutching, LCSW Phone Number:  10/01/2020, 12:47 PM  Clinical Narrative:                 CSW called and spoke w/ pt mother Vincent Singh at [phone]. Introduced self, role, reason for call. Confirmed pt lives at [facility] group home and has resided there for over 2 decades. Pt contact at group home is Vincent Singh who can be reached at [phone]- per mother pt group home usually picks him up at discharge. Pt mother inquiring about medical update. Provided her number to hospitalist Dr. Welton Flakes and Will, PA with [facility]. TOC team to follow.  Expected Discharge Plan: Group Home Barriers to Discharge: Continued Medical Work up   Patient Goals and CMS Choice Patient states their goals for this hospitalization and ongoing recovery are:: return to the group home when ready   Choice offered to / list presented to : [facility] POA / Guardian, Parent (mother Vincent Singh)  Expected Discharge Plan and Services Expected Discharge Plan: Group Home In-house Referral: Clinical Social Work Discharge Planning Services: CM Consult Post Acute Care Choice: Resumption of Svcs/PTA Provider Living arrangements for the past 2 months: Group Home    Prior Living Arrangements/Services Living arrangements for the past 2 months: Group Home Lives with:: Facility Resident Patient language and need for interpreter reviewed:: Yes (no needs) Do you feel safe going back to the place where you live?: Yes      Need for Family Participation in Patient Care: Yes (Comment) (assistance w/ daily cares) Care giver support system in place?: Yes (comment) (facility staff; family support)   Criminal Activity/Legal Involvement Pertinent to Current Situation/Hospitalization: No - Comment as needed  Permission Sought/Granted Permission sought to share information with :  Family Supports Permission granted to share information with : No (pt w/ intellectual disability)  Share Information with NAME: Vincent Singh  Permission granted to share info w AGENCY: [facility]  Permission granted to share info w Relationship: mother  Permission granted to share info w Contact Information: [phone]  Emotional Assessment Appearance:: Other (Comment Required (telephone assessment w/ pt mother) Attitude/Demeanor/Rapport: Other (comment) (telephone assessment w/ pt mother) Affect (typically observed): Other (comment) Orientation: :  (within defined limits due to intellectual disability) Alcohol / Substance Use: Not Applicable Psych Involvement: Outpatient Provider  Admission diagnosis:  Hyponatremia [E87.1] Small bowel obstruction (HCC) [K56.609] Right inguinal hernia [K40.90] AKI (acute kidney injury) (HCC) [N17.9] Incarcerated hernia [K46.0] Incarcerated right inguinal hernia [K40.30] Patient Active Problem List   Diagnosis Date Noted   Incarcerated right inguinal hernia 10/01/2020   AKI (acute kidney injury) (HCC) 10/01/2020   Incarcerated hernia 10/01/2020   Right inguinal hernia 02/25/2014   PCP:  Nelida Meuse, MD Pharmacy:   [facility] - [place], [place] - [address] [place] [address] Phone: [phone] Fax: [phone]  [facility] - [place], [place] - [address] [place] [address] Phone: [phone] Fax: [phone]  Readmission Risk Interventions Readmission Risk Prevention Plan 10/01/2020  Post Dischage Appt Not Complete  Appt Comments pending medical work up  Medication Screening Complete  Transportation Screening Complete  Some recent data might be hidden

## 2020-10-01 NOTE — Progress Notes (Signed)
Vincent Singh is a 45 y.o. male patient admitted from ED awake, alert - oriented  X 4 - no acute distress noted.  VSS - Blood pressure 129/85, pulse (!) 102, temperature 98.2 F (36.8 C), temperature source Oral, resp. rate 20, height 6\' 3"  (1.905 m), weight 85.3 kg, SpO2 100 %.    IV in place, occlusive dsg intact without redness.    Will cont to eval and treat per MD orders.  , RN 10/01/2020 6:46 AM

## 2020-10-01 NOTE — H&P (Addendum)
History and Physical    Vincent Singh KTG:[id number] DOB: 1976/12/24 DOA: 09/30/2020  PCP: Vincent Meuse, MD  Patient coming from: Group home.  Chief Complaint: Nausea vomiting.  HPI: Vincent Singh is a 44 y.o. male with history of mentally challenged has been experiencing nausea vomiting for the last 3 to 4 days.  Denies any abdominal pain.  Does not recall his last bowel movement.  Due to persistent symptoms patient presents to the ER.  ED Course: In the ER CT abdomen and pelvis shows right inguinal hernia which was incarcerated and on-call general surgery was consulted.  The hernia was reduced and patient is being admitted for further observation.  Labs also show acute renal failure with hyponatremia.  Patient was started on fluids.  Covid test is negative.  Patient's LFTs are elevated with AST of 47 and ALT of 153.  Blood glucose 160.  Review of Systems: As per HPI, rest all negative.   Past Medical History:  Diagnosis Date  . Anxiety    ANXIETY OVER SURGERY AND NEEDLES- TALKS A LOT WHEN ANXIOUS  . Complication of anesthesia    SOME AGGITATION WAKING UP AFTER WISDOM TEETH EXTRACTED  . Hyperactive gag reflex    DOES NOT TOLERATE ORAL THERMOMETER  . Inguinal hernia    RIGHT INGUINAL HERNIA - CAUSED PAIN ABOUT 3 WEEKS   . 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 HIS MOTHER / HCPOA  Vincent Singh WITH HIM TODAY AT [facility]OP AND ANSWERED MEDICAL HX QUESTIONS.    Past Surgical History:  Procedure Laterality Date  . HERNIA REPAIR    . INGUINAL HERNIA REPAIR Right 02/28/2014   Procedure: HERNIA REPAIR INGUINAL ADULT;  Surgeon: [name]. [name], MD;  Location: [facility];  Service: General;  Laterality: Right;  . INSERTION OF MESH Right 02/28/2014   Procedure: INSERTION OF MESH;  Surgeon: [name]. [name], MD;  Location: [facility];  Service: General;  Laterality: Right;  . WISDOM  TEETH EXTRACTIONS - 20 YRS AGO       reports that he has never smoked. He has never used smokeless tobacco. He reports that he does not drink alcohol and does not use drugs.  No Known Allergies  Family History  Family history unknown: Yes    Prior to Admission medications   Medication Sig Start Date End Date Taking? Authorizing Provider  ergocalciferol (VITAMIN D2) 1.25 MG (50000 UT) capsule Take 50,000 Units by mouth once a week. Tuesdays   Yes [provider]  Multiple Vitamin (MULTIVITAMIN WITH MINERALS) TABS tablet Take 1 tablet by mouth daily.    Yes [provider]  ondansetron (ZOFRAN-ODT) 4 MG disintegrating tablet Take 4 mg by mouth every 8 (eight) hours as needed for nausea or vomiting.   Yes [provider]    Physical Exam: Constitutional: Moderately built and nourished. Vitals:   09/30/20 2300 09/30/20 2315 09/30/20 2330 09/30/20 2345  BP: 125/85     Pulse: (!) 116 (!) 113 (!) 114 (!) 117  Resp: 16 13 17 17   Temp:      TempSrc:      SpO2: 95% 97% 95% 95%  Weight:      Height:       Eyes: Anicteric no pallor. ENMT: No discharge from the ears eyes nose or mouth. Neck: No mass felt.  No neck rigidity. Respiratory: No rhonchi or  crepitations. Cardiovascular: S1-S2 heard. Abdomen: Soft nontender bowel sounds present. Musculoskeletal: No edema. Skin: No rash. Neurologic: Alert awake oriented to his name and place.  Moves all extremities. Psychiatric: Oriented to his name and place.   Labs on Admission: I have personally reviewed following labs and imaging studies  CBC: Recent Labs  Lab 09/30/20 1535  WBC 9.1  HGB 20.5*  HCT 57.3*  MCV 81.0  PLT 265   Basic Metabolic Panel: Recent Labs  Lab 09/30/20 1535  NA 126*  K 3.6  CL 84*  CO2 21*  GLUCOSE 160*  BUN 80*  CREATININE 2.88*  CALCIUM 9.3   GFR: Estimated Creatinine Clearance: 38.7 mL/min (A) (by C-G formula based on SCr of 2.88 mg/dL (H)). Liver Function  Tests: Recent Labs  Lab 09/30/20 1535  AST 47*  ALT 153*  ALKPHOS 56  BILITOT 1.7*  PROT 8.7*  ALBUMIN 4.8   Recent Labs  Lab 09/30/20 1535  LIPASE 21   No results for input(s): AMMONIA in the last 168 hours. Coagulation Profile: No results for input(s): INR, PROTIME in the last 168 hours. Cardiac Enzymes: No results for input(s): CKTOTAL, CKMB, CKMBINDEX, TROPONINI in the last 168 hours. BNP (last 3 results) No results for input(s): PROBNP in the last 8760 hours. HbA1C: No results for input(s): HGBA1C in the last 72 hours. CBG: No results for input(s): GLUCAP in the last 168 hours. Lipid Profile: No results for input(s): CHOL, HDL, LDLCALC, TRIG, CHOLHDL, LDLDIRECT in the last 72 hours. Thyroid Function Tests: No results for input(s): TSH, T4TOTAL, FREET4, T3FREE, THYROIDAB in the last 72 hours. Anemia Panel: No results for input(s): VITAMINB12, FOLATE, FERRITIN, TIBC, IRON, RETICCTPCT in the last 72 hours. Urine analysis: No results found for: COLORURINE, APPEARANCEUR, LABSPEC, PHURINE, GLUCOSEU, HGBUR, BILIRUBINUR, KETONESUR, PROTEINUR, UROBILINOGEN, NITRITE, LEUKOCYTESUR Sepsis Labs: @LABRCNTIP (procalcitonin:4,lacticidven:4) ) Recent Results (from the past 240 hour(s))  Respiratory Panel by RT PCR (Flu A&B, Covid) - Nasopharyngeal Swab     Status: None   Collection Time: 09/30/20  6:48 PM   Specimen: Nasopharyngeal Swab  Result Value Ref Range Status   SARS Coronavirus 2 by RT PCR NEGATIVE NEGATIVE Final    Comment: (NOTE) SARS-CoV-2 target nucleic acids are NOT DETECTED.  The SARS-CoV-2 RNA is generally detectable in upper respiratoy specimens during the acute phase of infection. The lowest concentration of SARS-CoV-2 viral copies this assay can detect is 131 copies/mL. A negative result does not preclude SARS-Cov-2 infection and should not be used as the sole basis for treatment or other patient management decisions. A negative result may occur with   improper specimen collection/handling, submission of specimen other than nasopharyngeal swab, presence of viral mutation(s) within the areas targeted by this assay, and inadequate number of viral copies (<131 copies/mL). A negative result must be combined with clinical observations, patient history, and epidemiological information. The expected result is Negative.  Fact Sheet for Patients:  11/30/20  Fact Sheet for Healthcare Providers:  https://www.moore.com/  This test is no t yet approved or cleared by the https://www.young.biz/ FDA and  has been authorized for detection and/or diagnosis of SARS-CoV-2 by FDA under an Emergency Use Authorization (EUA). This EUA will remain  in effect (meaning this test can be used) for the duration of the COVID-19 declaration under Section 564(b)(1) of the Act, 21 U.S.C. section 360bbb-3(b)(1), unless the authorization is terminated or revoked sooner.     Influenza A by PCR NEGATIVE NEGATIVE Final   Influenza B by PCR NEGATIVE NEGATIVE Final  Comment: (NOTE) The Xpert Xpress SARS-CoV-2/FLU/RSV assay is intended as an aid in  the diagnosis of influenza from Nasopharyngeal swab specimens and  should not be used as a sole basis for treatment. Nasal washings and  aspirates are unacceptable for Xpert Xpress SARS-CoV-2/FLU/RSV  testing.  Fact Sheet for Patients: https://www.moore.com/https://www.fda.gov/media/142436/download  Fact Sheet for Healthcare Providers: https://www.young.biz/https://www.fda.gov/media/142435/download  This test is not yet approved or cleared by the [place] FDA and  has been authorized for detection and/or diagnosis of SARS-CoV-2 by  FDA under an Emergency Use Authorization (EUA). This EUA will remain  in effect (meaning this test can be used) for the duration of the  Covid-19 declaration under Section 564(b)(1) of the Act, 21  U.S.C. section 360bbb-3(b)(1), unless the authorization is  terminated or  revoked. Performed at [facility], [address] [place], [place] [id number]      Radiological Exams on Admission: CT Abdomen Pelvis Wo Contrast  Result Date: 09/30/2020 CLINICAL DATA:  Nausea and vomiting EXAM: CT ABDOMEN AND PELVIS WITHOUT CONTRAST TECHNIQUE: Multidetector CT imaging of the abdomen and pelvis was performed following the standard protocol without IV contrast. COMPARISON:  Ultrasound from earlier in the same day. FINDINGS: Lower chest: No acute abnormality. Hepatobiliary: Liver is within normal limits. Gallbladder is contracted. Known gallstones are not well appreciated on this exam. Pancreas: Unremarkable. No pancreatic ductal dilatation or surrounding inflammatory changes. Spleen: Normal in size without focal abnormality. Adrenals/Urinary Tract: Adrenal glands are within normal limits. Kidneys demonstrate no renal calculi or urinary tract obstructive changes. Stomach/Bowel: Scattered diverticular change of the colon is noted. No evidence of diverticulitis is seen. The appendix is within normal limits. Terminal ileum is unremarkable. In the mid to distal ileum there is evidence of a right inguinal hernia containing a loop of distal small bowel causing proximal small bowel obstruction. Fluid is noted within the distal esophagus consistent with reflux. The stomach is otherwise within normal limits. Vascular/Lymphatic: No significant vascular findings are present. No enlarged abdominal or pelvic lymph nodes. Reproductive: Prostate is unremarkable. Other: No abdominal ascites is seen. Right inguinal hernia is noted as described above containing a loop of incarcerated small bowel causing proximal small-bowel obstruction involving the jejunum and proximal ileum. Musculoskeletal: No acute or significant osseous findings. IMPRESSION: Small-bowel obstruction in the mid to distal ileum secondary to an incarcerated right inguinal hernia. Previously seen cholelithiasis is not well appreciated.  No other focal abnormality is noted. Electronically Signed   By: Alcide CleverMark  Lukens M.D.   On: 09/30/2020 20:25   DG Abdomen 1 View  Result Date: 09/30/2020 CLINICAL DATA:  Constipation vomiting EXAM: ABDOMEN - 1 VIEW COMPARISON:  None. FINDINGS: Dilated small bowel loops in the central abdomen up to 4.6 cm. Some scattered colon gas but relative paucity. No radiopaque calculi. IMPRESSION: Dilated small bowel loops in the central abdomen, appearance is concerning for small bowel obstruction. Electronically Signed   By: Jasmine PangKim  Fujinaga M.D.   On: 09/30/2020 19:24   US Abdomen Limited  Result Date: 09/30/2020 CLINICAL DATA:  Right upper quadrant pain with elevated LFTs EXAM: ULTRASOUND ABDOMEN LIMITED RIGHT UPPER QUADRANT COMPARISON:  None. FINDINGS: Gallbladder: Decompressed with multiple gallstones within. No pericholecystic fluid is noted. Negative sonographic Murphy's sign is elicited. Common bile duct: Diameter: 3 mm. Liver: Mild increased echogenicity is noted consistent with fatty infiltration. No focal mass is seen. Portal vein is patent on color Doppler imaging with normal direction of blood flow towards the liver. Other: None. IMPRESSION: Decompressed gallbladder with  multiple gallstones within. Fatty liver. No acute abnormality noted. Electronically Signed   By: Alcide Clever M.D.   On: 09/30/2020 20:00    Assessment/Plan Principal Problem:   Incarcerated right inguinal hernia Active Problems:   AKI (acute kidney injury) (HCC)   Incarcerated hernia    1. Incarcerated right inguinal hernia with bowel obstruction which has been reduced by general surgery at this time plan is to observe patient likely going for surgery in the morning.  We will keep patient n.p.o. hydration. 2. Acute renal failure with hyponatremia likely from dehydration and vomiting.  Patient is on saline closely follow metabolic panel.  UA is largely unremarkable. 3. Elevated LFTs with gallstones -we will follow LFTs closely.   Patient denies any right upper quadrant pain at this time.  4. Elevated blood sugar will check hemoglobin A1c with next blood draw. 5. Patient is mentally challenged.  Given that patient has small bowel obstruction with incarcerated hernia with acute renal failure and elevated LFTs will need close monitoring for any further worsening in inpatient status.   DVT prophylaxis: SCDs.  Avoiding anticoagulation in anticipation of possible procedure. Code Status: Full code. Family Communication: Discussed with patient. Disposition Plan: Group home when stable. Consults called: General surgery. Admission status: Inpatient.   Eduard Clos MD [facility] Pager [phone].  If 7PM-7AM, please contact night-coverage [email or web address] Password [email or web address]  10/01/2020, 12:20 AM

## 2020-10-01 NOTE — Progress Notes (Addendum)
    TK:PTWSFK and vomiting  Subjective: Patient was sleeping soundly when I went into the room.  He awoke without difficulty.  He has no abdominal discomfort.  No further nausea, the right inguinal hernia has been reduced.  Objective: Vital signs in last 24 hours: Temp:  [97.7 F (36.5 C)-98.2 F (36.8 C)] 98.2 F (36.8 C) (10/06 0612) Pulse Rate:  [98-122] 102 (10/06 0612) Resp:  [11-24] 20 (10/06 0612) BP: (104-138)/(68-93) 129/85 (10/06 0612) SpO2:  [93 %-100 %] 100 % (10/06 0612) Weight:  [85.3 kg] 85.3 kg (10/05 1531)  N.p.o. 3000 IV recorded 450 urine recorded BM x1 recorded Afebrile, tachycardic, BP stable NA 129, potassium 3.4, glucose 124, creatinine 1.71, calcium 7.7 WBC 8.3 H/H 16.7/47.3 Urinalysis unremarkable A.M. abdominal film pending Intake/Output from previous day: 10/05 0701 - 10/06 0700 In: 3000 [I.V.:3000] Out: 450 [Urine:450] Intake/Output this shift: No intake/output data recorded.  General appearance: alert, cooperative and no distress Resp: clear to auscultation bilaterally GI: soft, non-tender; bowel sounds normal; no masses,  no organomegaly and No palpable right inguinal hernia this AM.  Lab Results:  Recent Labs    09/30/20 1535 10/01/20 0435  WBC 9.1 8.3  HGB 20.5* 16.7  HCT 57.3* 47.3  PLT 265 190    BMET Recent Labs    09/30/20 1535 10/01/20 0435  NA 126* 129*  K 3.6 3.4*  CL 84* 93*  CO2 21* 22  GLUCOSE 160* 124*  BUN 80* 63*  CREATININE 2.88* 1.71*  CALCIUM 9.3 7.7*   PT/INR No results for input(s): LABPROT, INR in the last 72 hours.  Recent Labs  Lab 09/30/20 1535  AST 47*  ALT 153*  ALKPHOS 56  BILITOT 1.7*  PROT 8.7*  ALBUMIN 4.8     Lipase     Component Value Date/Time   LIPASE 21 09/30/2020 1535   . sodium chloride 125 mL/hr at 10/01/20 0500     Medications:   Assessment/Plan Developmental delay/group home AKI/dehydration  - Creatinine 2.88>> 1.71  - H/H 20.5/57.3>> 16.7/47.3  - WBC  9.1>> 8.3 Elevated LFT's - No repeat labs this AM HOH - signs and speaks   Nausea and vomiting Incarcerated right inguinal hernia - reduced Hx RIH with Mesh 02/28/14 Dr. [name]  FEN: IV fluids/n.p.o. ID: None DVT: SCDs Follow-up: TBD  Plan: A.m. film is still pending, but he is not distended.  He is having no nausea, no vomiting, no abdominal discomfort.  Lactate and abdominal film are still pending.  Based on physical exam I would start him on clears.  I would let him recover from his severe dehydration and AKI.  If the hernia remains reduced and he can tolerate a diet he could be discharged home.  He can follow-up follow-up as an outpatient with Dr. [name] for elective laparoscopic repair of his recurrent right inguinal hernia.  If the hernia/obstruction should recur before this he would most likely require an open repair.  Film is unremarkable, lactate is still pending from 7AM.  I will put him on clears. I talked to his mother and explained the plan.  If he is doing well tomorrow we need to talk with the group home about disposition.  His mother says if they come up to visit he will get very agitated, and so the family is not coming today.     LOS: 1 day    Caylee Vlachos 10/01/2020 Please see Amion

## 2020-10-01 NOTE — ED Notes (Addendum)
Pt was awake, has started to hiccup again.  He appears calm and relaxed.  No complaints of pain at this time.

## 2020-10-01 NOTE — Progress Notes (Signed)
Patient is a 44 year old mentally challenged Caucasian male with history of inguinal hernia repair presented to ED with complaints of intractable nausea and vomiting for the last 3 to 4 days.  In ED, CT abdomen/pelvis showed right sided inguinal hernia which was incarcerated and general surgery was consulted.  The hernia was reduced by general surgery and patient was admitted for further observation.  Patient was also hyponatremic and had acute kidney injury.  Patient was managed with IV fluids in the ED.  And given IV Zofran and Phenergan.  The repeated labs showed improvement of creatinine from 2.88-1.71 and sodium improved from 126 and 129.  During my evaluation patient is sitting comfortably in the bed and denies any complaints.  The nurse reported that patient had bowel movement this morning and also passed large of flatus.  We will continue the same management at this time.

## 2020-10-02 LAB — COMPREHENSIVE METABOLIC PANEL
ALT: 78 U/L — ABNORMAL HIGH (ref 0–44)
AST: 21 U/L (ref 15–41)
Albumin: 3 g/dL — ABNORMAL LOW (ref 3.5–5.0)
Alkaline Phosphatase: 37 U/L — ABNORMAL LOW (ref 38–126)
Anion gap: 9 (ref 5–15)
BUN: 24 mg/dL — ABNORMAL HIGH (ref 6–20)
CO2: 26 mmol/L (ref 22–32)
Calcium: 7.9 mg/dL — ABNORMAL LOW (ref 8.9–10.3)
Chloride: 96 mmol/L — ABNORMAL LOW (ref 98–111)
Creatinine, Ser: 1.23 mg/dL (ref 0.61–1.24)
GFR calc non Af Amer: >60 mL/min (ref >60)
Glucose, Bld: 104 mg/dL — ABNORMAL HIGH (ref 70–99)
Potassium: 3.7 mmol/L (ref 3.5–5.1)
Sodium: 131 mmol/L — ABNORMAL LOW (ref 135–145)
Total Bilirubin: 0.9 mg/dL (ref 0.3–1.2)
Total Protein: 5.7 g/dL — ABNORMAL LOW (ref 6.5–8.1)

## 2020-10-02 LAB — CBC
HCT: 45.2 % (ref 39.0–52.0)
Hemoglobin: 15.1 g/dL (ref 13.0–17.0)
MCH: 28 pg (ref 26.0–34.0)
MCHC: 33.4 g/dL (ref 30.0–36.0)
MCV: 83.9 fL (ref 80.0–100.0)
Platelets: 194 10*3/uL (ref 150–400)
RBC: 5.39 MIL/uL (ref 4.22–5.81)
RDW: 12.5 % (ref 11.5–15.5)
WBC: 7.1 10*3/uL (ref 4.0–10.5)
nRBC: 0 % (ref 0.0–0.2)

## 2020-10-02 LAB — LACTIC ACID, PLASMA: Lactic Acid, Venous: 1.5 mmol/L (ref 0.5–1.9)

## 2020-10-02 MED ORDER — LORAZEPAM 1 MG PO TABS
1.0000 mg | ORAL_TABLET | Freq: Once | ORAL | Status: AC
  Administered 2020-10-02: 1 mg via ORAL
  Filled 2020-10-02: qty 1

## 2020-10-02 NOTE — Social Work (Signed)
If pt stable for discharge please call [name] from pt group home [phone]) [facility] and he will come pick pt up- please send pt w/ AVS.   Vincent Singh, MSW, LCSW [facility]

## 2020-10-02 NOTE — Progress Notes (Addendum)
PROGRESS NOTE    Vincent Singh  VHQ:[id number] DOB: Feb 23, 1976 DOA: 09/30/2020 PCP: Nelida Meuse, MD (Confirm with patient/family/NH records and if not entered, this HAS to be entered at [facility] point of entry. "No PCP" if truly none.)   Brief Narrative: (Start on day 1 of progress note - keep it brief and live)  Patient is a 44 year old mentally challenged Caucasian male with history of inguinal hernia repair presented to ED with complaints of intractable nausea and vomiting for the last 3 to 4 days.  In ED, CT abdomen/pelvis showed right sided inguinal hernia which was incarcerated and general surgery was consulted.  The hernia was reduced by general surgery and patient was admitted for further observation.  Patient was also hyponatremic and had acute kidney injury.  Patient was managed with IV fluids in the ED.  And given IV Zofran and Phenergan.  The repeated labs showed improvement of creatinine from 2.88-1.71 and sodium improved from 126 and 129.  Patient was started on clear liquids by general surgery and he tolerated it very well and today the diet is advanced to soft diet and patient was unable to tolerated patient had multiple episodes of vomiting after that and was given Zofran.  Patient is not ready to be discharged today.   Assessment & Plan:   Principal Problem:   Incarcerated right inguinal hernia Active Problems:   AKI (acute kidney injury) (HCC)   Incarcerated hernia   Incarcerated right inguinal hernia with bowel obstruction present on admission CT abdomen/pelvis showed right inguinal hernia which was incarcerated and hernia was reduced by general surgery and patient admitted for further observation.  Patient had a bowel movement yesterday and was passing flatus and GI started him on clear diet which he tolerated very well.  Patient's diet is advanced to soft diet today but he was unable to tolerate.  Had multiple episodes of vomiting.  Continue to monitor.  IV Zofran and IV  promethazine as needed for nausea and vomiting.  Acute renal failure Patient had creatinine level of 2.88 on arrival to the hospital and creatinine improved to 1.2 today.  Continue IV hydration and continue to monitor BMP.  Hyponatremia Hyponatremia also improved and sodium is 131 today.  Continue to monitor  Mental retardation Patient at his baseline   DVT prophylaxis: SCDs Code Status: Full Family Communication: (Specify name, relationship & date discussed. NO "discussed with patient") Disposition Plan: Group home once the patient becomes stable   Consultants:   General surgery  Procedures: (Don't include imaging studies which can be auto populated. Include things that cannot be auto populated i.e. Echo, Carotid and venous dopplers, Foley, Bipap, HD, tubes/drains, wound vac, central lines etc)  None  Antimicrobials: (specify start and planned stop date. Auto populated tables are space occupying and do not give end dates)     Subjective: Patient seen and evaluated at the bedside today.  Patient states that he had multiple episodes of vomiting today and not feeling well.  Patient admits of feeling weak but denies any fever, chills, chest pain, shortness of breath, abdominal pain and urinary symptoms.  Objective: Vitals:   10/01/20 0612 10/01/20 2035 10/02/20 0427 10/02/20 1215  BP: 129/85 138/84 124/70 126/77  Pulse: (!) 102 (!) 108 98 93  Resp: 20 18 18 17   Temp: 98.2 F (36.8 C) 98.1 F (36.7 C) 98.2 F (36.8 C) 98.1 F (36.7 C)  TempSrc: Oral Oral Oral Oral  SpO2: 100% 99% 100% 98%  Weight:  Height:        Intake/Output Summary (Last 24 hours) at 10/02/2020 1628 Last data filed at 10/02/2020 1500 Gross per 24 hour  Intake 3626.47 ml  Output 1500 ml  Net 2126.47 ml   Filed Weights   09/30/20 1531  Weight: 85.3 kg    Examination:  General exam: Appears calm and comfortable  Respiratory system: Clear to auscultation. Respiratory effort  normal. Cardiovascular system: S1 & S2 heard, RRR. No JVD, murmurs, rubs, gallops or clicks. No pedal edema. Gastrointestinal system: Abdomen is nondistended, soft and nontender. No organomegaly or masses felt. Normal bowel sounds heard. Central nervous system: Alert and oriented. No focal neurological deficits. Extremities: Symmetric 5 x 5 power. Skin: No rashes, lesions or ulcers Psychiatry: Patient is mentally challenged at baseline.  Patient is not in acute distress.    Data Reviewed: I have personally reviewed following labs and imaging studies  CBC: Recent Labs  Lab 09/30/20 1535 10/01/20 0435 10/02/20 0117  WBC 9.1 8.3 7.1  HGB 20.5* 16.7 15.1  HCT 57.3* 47.3 45.2  MCV 81.0 80.9 83.9  PLT 265 190 194   Basic Metabolic Panel: Recent Labs  Lab 09/30/20 1535 10/01/20 0435 10/02/20 0117  NA 126* 129* 131*  K 3.6 3.4* 3.7  CL 84* 93* 96*  CO2 21* 22 26  GLUCOSE 160* 124* 104*  BUN 80* 63* 24*  CREATININE 2.88* 1.71* 1.23  CALCIUM 9.3 7.7* 7.9*   GFR: Estimated Creatinine Clearance: 90.6 mL/min (by C-G formula based on SCr of 1.23 mg/dL). Liver Function Tests: Recent Labs  Lab 09/30/20 1535 10/02/20 0117  AST 47* 21  ALT 153* 78*  ALKPHOS 56 37*  BILITOT 1.7* 0.9  PROT 8.7* 5.7*  ALBUMIN 4.8 3.0*   Recent Labs  Lab 09/30/20 1535  LIPASE 21   No results for input(s): AMMONIA in the last 168 hours. Coagulation Profile: No results for input(s): INR, PROTIME in the last 168 hours. Cardiac Enzymes: No results for input(s): CKTOTAL, CKMB, CKMBINDEX, TROPONINI in the last 168 hours. BNP (last 3 results) No results for input(s): PROBNP in the last 8760 hours. HbA1C: Recent Labs    10/01/20 0435  HGBA1C 5.0   CBG: No results for input(s): GLUCAP in the last 168 hours. Lipid Profile: No results for input(s): CHOL, HDL, LDLCALC, TRIG, CHOLHDL, LDLDIRECT in the last 72 hours. Thyroid Function Tests: No results for input(s): TSH, T4TOTAL, FREET4,  T3FREE, THYROIDAB in the last 72 hours. Anemia Panel: No results for input(s): VITAMINB12, FOLATE, FERRITIN, TIBC, IRON, RETICCTPCT in the last 72 hours. Sepsis Labs: Recent Labs  Lab 10/02/20 0117  LATICACIDVEN 1.5    Recent Results (from the past 240 hour(s))  Respiratory Panel by RT PCR (Flu A&B, Covid) - Nasopharyngeal Swab     Status: None   Collection Time: 09/30/20  6:48 PM   Specimen: Nasopharyngeal Swab  Result Value Ref Range Status   SARS Coronavirus 2 by RT PCR NEGATIVE NEGATIVE Final    Comment: (NOTE) SARS-CoV-2 target nucleic acids are NOT DETECTED.  The SARS-CoV-2 RNA is generally detectable in upper respiratoy specimens during the acute phase of infection. The lowest concentration of SARS-CoV-2 viral copies this assay can detect is 131 copies/mL. A negative result does not preclude SARS-Cov-2 infection and should not be used as the sole basis for treatment or other patient management decisions. A negative result may occur with  improper specimen collection/handling, submission of specimen other than nasopharyngeal swab, presence of viral mutation(s) within the areas  targeted by this assay, and inadequate number of viral copies (<131 copies/mL). A negative result must be combined with clinical observations, patient history, and epidemiological information. The expected result is Negative.  Fact Sheet for Patients:  https://www.moore.com/  Fact Sheet for Healthcare Providers:  https://www.young.biz/  This test is no t yet approved or cleared by the [place] FDA and  has been authorized for detection and/or diagnosis of SARS-CoV-2 by FDA under an Emergency Use Authorization (EUA). This EUA will remain  in effect (meaning this test can be used) for the duration of the COVID-19 declaration under Section 564(b)(1) of the Act, 21 U.S.C. section 360bbb-3(b)(1), unless the authorization is terminated or revoked sooner.      Influenza A by PCR NEGATIVE NEGATIVE Final   Influenza B by PCR NEGATIVE NEGATIVE Final    Comment: (NOTE) The Xpert Xpress SARS-CoV-2/FLU/RSV assay is intended as an aid in  the diagnosis of influenza from Nasopharyngeal swab specimens and  should not be used as a sole basis for treatment. Nasal washings and  aspirates are unacceptable for Xpert Xpress SARS-CoV-2/FLU/RSV  testing.  Fact Sheet for Patients: https://www.moore.com/  Fact Sheet for Healthcare Providers: https://www.young.biz/  This test is not yet approved or cleared by the [place] FDA and  has been authorized for detection and/or diagnosis of SARS-CoV-2 by  FDA under an Emergency Use Authorization (EUA). This EUA will remain  in effect (meaning this test can be used) for the duration of the  Covid-19 declaration under Section 564(b)(1) of the Act, 21  U.S.C. section 360bbb-3(b)(1), unless the authorization is  terminated or revoked. Performed at [facility], [address], [place], [place] [address]          Radiology Studies: CT Abdomen Pelvis Wo Contrast  Result Date: 09/30/2020 CLINICAL DATA:  Nausea and vomiting EXAM: CT ABDOMEN AND PELVIS WITHOUT CONTRAST TECHNIQUE: Multidetector CT imaging of the abdomen and pelvis was performed following the standard protocol without IV contrast. COMPARISON:  Ultrasound from earlier in the same day. FINDINGS: Lower chest: No acute abnormality. Hepatobiliary: Liver is within normal limits. Gallbladder is contracted. Known gallstones are not well appreciated on this exam. Pancreas: Unremarkable. No pancreatic ductal dilatation or surrounding inflammatory changes. Spleen: Normal in size without focal abnormality. Adrenals/Urinary Tract: Adrenal glands are within normal limits. Kidneys demonstrate no renal calculi or urinary tract obstructive changes. Stomach/Bowel: Scattered diverticular change of the colon is noted. No  evidence of diverticulitis is seen. The appendix is within normal limits. Terminal ileum is unremarkable. In the mid to distal ileum there is evidence of a right inguinal hernia containing a loop of distal small bowel causing proximal small bowel obstruction. Fluid is noted within the distal esophagus consistent with reflux. The stomach is otherwise within normal limits. Vascular/Lymphatic: No significant vascular findings are present. No enlarged abdominal or pelvic lymph nodes. Reproductive: Prostate is unremarkable. Other: No abdominal ascites is seen. Right inguinal hernia is noted as described above containing a loop of incarcerated small bowel causing proximal small-bowel obstruction involving the jejunum and proximal ileum. Musculoskeletal: No acute or significant osseous findings. IMPRESSION: Small-bowel obstruction in the mid to distal ileum secondary to an incarcerated right inguinal hernia. Previously seen cholelithiasis is not well appreciated. No other focal abnormality is noted. Electronically Signed   By: Alcide Clever M.D.   On: 09/30/2020 20:25   DG Abdomen 1 View  Result Date: 09/30/2020 CLINICAL DATA:  Constipation vomiting EXAM: ABDOMEN - 1 VIEW COMPARISON:  None. FINDINGS:  Dilated small bowel loops in the central abdomen up to 4.6 cm. Some scattered colon gas but relative paucity. No radiopaque calculi. IMPRESSION: Dilated small bowel loops in the central abdomen, appearance is concerning for small bowel obstruction. Electronically Signed   By: Jasmine Pang M.D.   On: 09/30/2020 19:24   US Abdomen Limited  Result Date: 09/30/2020 CLINICAL DATA:  Right upper quadrant pain with elevated LFTs EXAM: ULTRASOUND ABDOMEN LIMITED RIGHT UPPER QUADRANT COMPARISON:  None. FINDINGS: Gallbladder: Decompressed with multiple gallstones within. No pericholecystic fluid is noted. Negative sonographic Murphy's sign is elicited. Common bile duct: Diameter: 3 mm. Liver: Mild increased echogenicity is noted  consistent with fatty infiltration. No focal mass is seen. Portal vein is patent on color Doppler imaging with normal direction of blood flow towards the liver. Other: None. IMPRESSION: Decompressed gallbladder with multiple gallstones within. Fatty liver. No acute abnormality noted. Electronically Signed   By: Alcide Clever M.D.   On: 09/30/2020 20:00   DG Abd Portable 1V  Result Date: 10/01/2020 CLINICAL DATA:  Small bowel obstruction. EXAM: PORTABLE ABDOMEN - 1 VIEW COMPARISON:  September 30, 2020. FINDINGS: Mildly dilated small bowel loop is noted in left upper quadrant which is improved compared to prior exam. No free air is noted on decubitus view. No radio-opaque calculi or other significant radiographic abnormality are seen. IMPRESSION: Decreased small bowel dilatation is noted compared to prior exam. Electronically Signed   By: Lupita Raider M.D.   On: 10/01/2020 13:52        Scheduled Meds: Continuous Infusions: . sodium chloride 125 mL/hr at 10/02/20 1441     LOS: 2 days        Thalia Party, MD [facility] Pager [phone]  If 7PM-7AM, please contact night-coverage [email or web address] Password  10/02/2020, 4:28 PM

## 2020-10-02 NOTE — Progress Notes (Signed)
° ° °  CC: Nausea and vomiting  Subjective: Patient looks good this a.m. no pain, tolerating clear liquids well. 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 He had multiple BMs yesterday.  Objective: Vital signs in last 24 hours: Temp:  [98.1 F (36.7 C)-98.2 F (36.8 C)] 98.2 F (36.8 C) (10/07 0427) Pulse Rate:  [98-108] 98 (10/07 0427) Resp:  [18] 18 (10/07 0427) BP: (124-138)/(70-84) 124/70 (10/07 0427) SpO2:  [99 %-100 %] 100 % (10/07 0427)   1350 clears IV 2615 Urine 775 BM x 3 Afebrile, VSS NA 131  Intake/Output from previous day: 10/06 0701 - 10/07 0700 In: 3965.5 [P.O.:1350; I.V.:2615.5] Out: 775 [Urine:775] Intake/Output this shift: No intake/output data recorded.  General appearance: alert, cooperative and no distress GI: soft, non-tender; bowel sounds normal; no masses,  no organomegaly and No palpable hernia, no tenderness right inguinal area.  Lab Results:  Recent Labs    10/01/20 0435 10/02/20 0117  WBC 8.3 7.1  HGB 16.7 15.1  HCT 47.3 45.2  PLT 190 194    BMET Recent Labs    10/01/20 0435 10/02/20 0117  NA 129* 131*  K 3.4* 3.7  CL 93* 96*  CO2 22 26  GLUCOSE 124* 104*  BUN 63* 24*  CREATININE 1.71* 1.23  CALCIUM 7.7* 7.9*   PT/INR No results for input(s): LABPROT, INR in the last 72 hours.  Recent Labs  Lab 09/30/20 1535 10/02/20 0117  AST 47* 21  ALT 153* 78*  ALKPHOS 56 37*  BILITOT 1.7* 0.9  PROT 8.7* 5.7*  ALBUMIN 4.8 3.0*     Lipase     Component Value Date/Time   LIPASE 21 09/30/2020 1535     Medications:   Assessment/Plan Developmental delay/group home AKI/dehydration  - Creatinine 2.88>> 1.71>>1.23  - H/H 20.5/57.3>> 16.7/47.3>>15.1/45.2  - WBC 9.1>> 8.3>>7.1 Elevated LFT's   -Alkaline phos 56>> 37  -AST 47>> 21  -ALT 153>> 78 Total bilirubin 1.7>> 0.9 HOH - signs and speaks   Nausea and vomiting Incarcerated right inguinal hernia - reduced Hx RIH with Mesh 02/28/14 Dr. [name]  FEN: IV  fluids/clear ID: None DVT: SCDs Follow-up: TBD   Plan: He is tolerating clear liquids well, he is intolerant to milk products some discomfort him on a soft diet. If he tolerates this well he can probably go home from our standpoint when okay with medicine. I work on getting him a follow-up appointment with Dr. [name], for evaluation and repair of recurrent right inguinal hernia.     LOS: 2 days    Marni Franzoni 10/02/2020 Please see Amion

## 2020-10-02 NOTE — TOC Transition Note (Signed)
Transition of Care [facility]) - CM/SW Discharge Note   Patient Details  Name: Vincent Singh MRN: [id number] Date of Birth: 1976/09/08  [facility] CM/SW Contact:  Doy Hutching, LCSW Phone Number: 10/02/2020, 12:14 PM   Clinical Narrative:    CSW spoke with [name], group home liaison, he is aware pt mayd/c today and is agreeable to be called when pt ready for pick up. CSW then passed the phone to RN Asher Muir who provided clinical update. Only request from [facility] (group home) is for pt to discharge w/ AVS. No report needed at this timeAsher Muir aware.    Final next level of care: Group Home Barriers to Discharge: Barriers Resolved   Patient Goals and CMS Choice Patient states their goals for this hospitalization and ongoing recovery are:: return to the group home when ready   Choice offered to / list presented to : [facility] POA / Guardian, Parent (mother Erskine Squibb)  Discharge Placement Patient to be transferred to facility by: group home representative Name of family member notified: pt group home coordinator [name] Patient and family notified of of transfer: 10/02/20  Discharge Plan and Services In-house Referral: Clinical Social Work Discharge Planning Services: CM Consult Post Acute Care Choice: Resumption of Svcs/PTA Provider            Readmission Risk Interventions Readmission Risk Prevention Plan 10/01/2020  Post Dischage Appt Not Complete  Appt Comments pending medical work up  Medication Screening Complete  Transportation Screening Complete  Some recent data might be hidden

## 2020-10-03 ENCOUNTER — Inpatient Hospital Stay (HOSPITAL_COMMUNITY): Payer: Medicaid Other

## 2020-10-03 LAB — CBC
HCT: 40.5 % (ref 39.0–52.0)
Hemoglobin: 13.7 g/dL (ref 13.0–17.0)
MCH: 28.5 pg (ref 26.0–34.0)
MCHC: 33.8 g/dL (ref 30.0–36.0)
MCV: 84.2 fL (ref 80.0–100.0)
Platelets: 172 10*3/uL (ref 150–400)
RBC: 4.81 MIL/uL (ref 4.22–5.81)
RDW: 12.4 % (ref 11.5–15.5)
WBC: 6.9 10*3/uL (ref 4.0–10.5)
nRBC: 0 % (ref 0.0–0.2)

## 2020-10-03 LAB — BASIC METABOLIC PANEL
Anion gap: 7 (ref 5–15)
BUN: 10 mg/dL (ref 6–20)
CO2: 29 mmol/L (ref 22–32)
Calcium: 7.8 mg/dL — ABNORMAL LOW (ref 8.9–10.3)
Chloride: 97 mmol/L — ABNORMAL LOW (ref 98–111)
Creatinine, Ser: 1.02 mg/dL (ref 0.61–1.24)
GFR calc non Af Amer: >60 mL/min (ref >60)
Glucose, Bld: 102 mg/dL — ABNORMAL HIGH (ref 70–99)
Potassium: 3.5 mmol/L (ref 3.5–5.1)
Sodium: 133 mmol/L — ABNORMAL LOW (ref 135–145)

## 2020-10-03 LAB — C DIFFICILE QUICK SCREEN W PCR REFLEX
C Diff antigen: NEGATIVE
C Diff interpretation: NOT DETECTED
C Diff toxin: NEGATIVE

## 2020-10-03 MED ORDER — POTASSIUM CHLORIDE CRYS ER 20 MEQ PO TBCR
20.0000 meq | EXTENDED_RELEASE_TABLET | Freq: Three times a day (TID) | ORAL | Status: DC
  Administered 2020-10-03 – 2020-10-04 (×4): 20 meq via ORAL
  Filled 2020-10-03 (×4): qty 1

## 2020-10-03 MED ORDER — POTASSIUM CHLORIDE CRYS ER 20 MEQ PO TBCR
40.0000 meq | EXTENDED_RELEASE_TABLET | Freq: Once | ORAL | Status: AC
  Administered 2020-10-03: 40 meq via ORAL
  Filled 2020-10-03: qty 2

## 2020-10-03 NOTE — Progress Notes (Signed)
    CC: Nausea and vomiting  Subjective: Patient feels fine this AM.  He wants to know when he can go home.  He had some nausea and vomiting last evening.  He is also had some diarrhea.  He is just ate a large breakfast and is not complaining of any discomfort this AM.  His exam is unremarkable.  Objective: Vital signs in last 24 hours: Temp:  [97.9 F (36.6 C)-98.3 F (36.8 C)] 98.3 F (36.8 C) (10/08 0404) Pulse Rate:  [93-101] 94 (10/08 0404) Resp:  [17-18] 18 (10/08 0404) BP: (126-135)/(67-85) 133/67 (10/08 0404) SpO2:  [97 %-99 %] 97 % (10/08 0404) Last BM Date: 10/02/20 1980 p.o. 2268 IV 2795 urine Emesis x1 BM x1 Afebrile vital signs are stable NA 133, potassium 3.5, glucose 102, WBC 6.9 A.m. film shows some mildly dilated small bowel loops in the left upper quadrant abdomen questionable ileus or possible distal small bowel obstruction Intake/Output from previous day: 10/07 0701 - 10/08 0700 In: 4248.9 [P.O.:1980; I.V.:2268.9] Out: 2795 [Urine:2795] Intake/Output this shift: Total I/O In: 300 [P.O.:300] Out: -   General appearance: alert, cooperative and no distress Resp: clear to auscultation bilaterally GI: He might be minimally distended.  He has no abdominal discomfort.  His hernia is reduced.  He has no pain or tenderness on palpation of the inguinal area.  Lab Results:  Recent Labs    10/02/20 0117 10/03/20 0021  WBC 7.1 6.9  HGB 15.1 13.7  HCT 45.2 40.5  PLT 194 172    BMET Recent Labs    10/02/20 0117 10/03/20 0021  NA 131* 133*  K 3.7 3.5  CL 96* 97*  CO2 26 29  GLUCOSE 104* 102*  BUN 24* 10  CREATININE 1.23 1.02  CALCIUM 7.9* 7.8*   PT/INR No results for input(s): LABPROT, INR in the last 72 hours.  Recent Labs  Lab 09/30/20 1535 10/02/20 0117  AST 47* 21  ALT 153* 78*  ALKPHOS 56 37*  BILITOT 1.7* 0.9  PROT 8.7* 5.7*  ALBUMIN 4.8 3.0*     Lipase     Component Value Date/Time   LIPASE 21 09/30/2020 1535      Medications:   Assessment/Plan Developmental delay/group home AKI/dehydration -Creatinine 2.88>>1.71>>1.23>>1.02 -H/H 20.5/57.3>>16.7/47.3>>15.1/45.2>>13.7/40.5 -WBC 9.1>>8.3>>7.1 Elevated LFT's   -Alkaline phos 56>> 37  -AST 47>> 21  -ALT 153>> 78 Total bilirubin 1.7>> 0.9 HOH - signs and speaks Hypokalemia K+ 3.5 >> replace to K+ 4.0 range  Nausea and vomiting Incarcerated right inguinal hernia-reduced Hx RIH with Mesh 02/28/14 Dr. [name]  FEN: IV fluids/clear ID: None DVT: SCDs Follow-up: TBD  Plan: We were hoping he can go home today.  He did have nausea, vomiting and some diarrhea last evening.  He is currently being evaluated for C. difficile and is enteric precautions. Will continue to observe today and see how he does. Get His K+ up to around 4.0. I have decreased his IV fluids and replaced K+/.  Labs in AM.       LOS: 3 days    Ankur Snowdon 10/03/2020 Please see Amion

## 2020-10-03 NOTE — Progress Notes (Signed)
PROGRESS NOTE    Vincent Singh  XTG:[id number] DOB: [birth year] DOA: 09/30/2020 PCP: Nelida Meuse, MD (Confirm with patient/family/NH records and if not entered, this HAS to be entered at [facility] point of entry. "No PCP" if truly none.)   Brief Narrative: (Start on day 1 of progress note - keep it brief and live) Patient is a 44 year old mentally challenged Caucasian male with history of inguinal hernia repair presented to ED with complaints of intractable nausea and vomiting for the last 3 to 4 days. In ED, CT abdomen/pelvis showed right sided inguinal hernia which was incarcerated and general surgery was consulted. The hernia was reduced by general surgery and patient was admitted for further observation. Patient was also hyponatremic and had acute kidney injury. Patient was managed with IV fluids in the ED. And given IV Zofran and Phenergan. The repeated labs showed improvement of creatinine from 2.88-1.71 and creatinine today is 1.01 and sodium improved from 126to 133 today.  Patient was started on clear liquids by general surgery and he tolerated it very well and today the diet is advanced to soft diet and patient was unable to tolerated patient had multiple episodes of vomiting after that and was given Zofran.  Yesterday patient had multiple episodes of loose stools and vomiting and C. difficile test is ordered.  Today patient ate his breakfast and waning of mild discomfort in the belly but no vomiting and diarrhea. Patient is not ready to be discharged today and will most probably be discharged tomorrow.   Assessment & Plan:   Principal Problem:   Incarcerated right inguinal hernia Active Problems: Intractable nausea, vomiting and diarrhea   AKI (acute kidney injury) (HCC)   Incarcerated hernia  Incarcerated right inguinal hernia with bowel obstruction present on admission CT abdomen/pelvis showed right inguinal hernia which was incarcerated and hernia was reduced by general surgery  and patient admitted for further observation.  Patient had a bowel movement 2 days ago and was passing flatus and GI started him on clear diet which he tolerated very well.  Patient's diet is advanced to soft diet yesterday but he was unable to tolerate.  Had multiple episodes of vomiting and diarrhea.  C. difficile test ordered yesterday and pending.   Patient was able to eat his breakfast today and complaining of mild abdominal discomfort but no vomiting and diarrhea till now.  IV Zofran and IV promethazine as needed for nausea and vomiting.  Acute renal failure present on admission, resolved Patient had creatinine level of 2.88 on arrival to the hospital and creatinine improved to 1.02 today.  Continue IV hydration and continue to monitor BMP.  Hyponatremia Hyponatremia also improved and sodium is 133 today.  Continue to monitor  Mental retardation Patient at his baseline and not showing any signs of agitation or anxiety    DVT prophylaxis: SCDs Code Status: Full Family Communication: No family member present at bedside Disposition Plan: Group home.   Consultants:   Surgery  Procedures: (Don't include imaging studies which can be auto populated. Include things that cannot be auto populated i.e. Echo, Carotid and venous dopplers, Foley, Bipap, HD, tubes/drains, wound vac, central lines etc)    Antimicrobials: (specify start and planned stop date. Auto populated tables are space occupying and do not give end dates)     Subjective: Patient seen and evaluated at the bedside this morning.  Patient's nurse reported that patient had multiple episodes of loose stool and vomiting and last episode of vomiting was last night  and since then patient has no vomiting and no further diarrhea.  Patient states that he feels his abdomen is in mild discomfort after breakfast but denies nausea and vomiting.  Objective: Vitals:   10/02/20 1824 10/02/20 2200 10/03/20 0404 10/03/20 1436  BP:  135/85 128/79 133/67 130/83  Pulse: (!) 101 93 94 100  Resp: 18 18 18 18   Temp: 97.9 F (36.6 C) 98.2 F (36.8 C) 98.3 F (36.8 C) 97.6 F (36.4 C)  TempSrc: Axillary Axillary Oral Axillary  SpO2: 99% 99% 97% 100%  Weight:      Height:        Intake/Output Summary (Last 24 hours) at 10/03/2020 1652 Last data filed at 10/03/2020 1437 Gross per 24 hour  Intake 3868.88 ml  Output 2370 ml  Net 1498.88 ml   Filed Weights   09/30/20 1531  Weight: 85.3 kg    Examination:  General exam: Appears calm and comfortable  Respiratory system: Clear to auscultation. Respiratory effort normal. Cardiovascular system: S1 & S2 heard, RRR. No JVD, murmurs, rubs, gallops or clicks. No pedal edema. Gastrointestinal system: Abdomen is nondistended, soft and nontender. No organomegaly or masses felt. Normal bowel sounds heard. Central nervous system: Alert and oriented. No focal neurological deficits. Extremities: Symmetric 5 x 5 power. Skin: No rashes, lesions or ulcers Psychiatry: Judgement and insight appear normal. Mood & affect appropriate.     Data Reviewed: I have personally reviewed following labs and imaging studies  CBC: Recent Labs  Lab 09/30/20 1535 10/01/20 0435 10/02/20 0117 10/03/20 0021  WBC 9.1 8.3 7.1 6.9  HGB 20.5* 16.7 15.1 13.7  HCT 57.3* 47.3 45.2 40.5  MCV 81.0 80.9 83.9 84.2  PLT 265 190 194 172   Basic Metabolic Panel: Recent Labs  Lab 09/30/20 1535 10/01/20 0435 10/02/20 0117 10/03/20 0021  NA 126* 129* 131* 133*  K 3.6 3.4* 3.7 3.5  CL 84* 93* 96* 97*  CO2 21* 22 26 29   GLUCOSE 160* 124* 104* 102*  BUN 80* 63* 24* 10  CREATININE 2.88* 1.71* 1.23 1.02  CALCIUM 9.3 7.7* 7.9* 7.8*   GFR: Estimated Creatinine Clearance: 109.3 mL/min (by C-G formula based on SCr of 1.02 mg/dL). Liver Function Tests: Recent Labs  Lab 09/30/20 1535 10/02/20 0117  AST 47* 21  ALT 153* 78*  ALKPHOS 56 37*  BILITOT 1.7* 0.9  PROT 8.7* 5.7*  ALBUMIN 4.8 3.0*    Recent Labs  Lab 09/30/20 1535  LIPASE 21   No results for input(s): AMMONIA in the last 168 hours. Coagulation Profile: No results for input(s): INR, PROTIME in the last 168 hours. Cardiac Enzymes: No results for input(s): CKTOTAL, CKMB, CKMBINDEX, TROPONINI in the last 168 hours. BNP (last 3 results) No results for input(s): PROBNP in the last 8760 hours. HbA1C: Recent Labs    10/01/20 0435  HGBA1C 5.0   CBG: No results for input(s): GLUCAP in the last 168 hours. Lipid Profile: No results for input(s): CHOL, HDL, LDLCALC, TRIG, CHOLHDL, LDLDIRECT in the last 72 hours. Thyroid Function Tests: No results for input(s): TSH, T4TOTAL, FREET4, T3FREE, THYROIDAB in the last 72 hours. Anemia Panel: No results for input(s): VITAMINB12, FOLATE, FERRITIN, TIBC, IRON, RETICCTPCT in the last 72 hours. Sepsis Labs: Recent Labs  Lab 10/02/20 0117  LATICACIDVEN 1.5    Recent Results (from the past 240 hour(s))  Respiratory Panel by RT PCR (Flu A&B, Covid) - Nasopharyngeal Swab     Status: None   Collection Time: 09/30/20  6:48 PM  Specimen: Nasopharyngeal Swab  Result Value Ref Range Status   SARS Coronavirus 2 by RT PCR NEGATIVE NEGATIVE Final    Comment: (NOTE) SARS-CoV-2 target nucleic acids are NOT DETECTED.  The SARS-CoV-2 RNA is generally detectable in upper respiratoy specimens during the acute phase of infection. The lowest concentration of SARS-CoV-2 viral copies this assay can detect is 131 copies/mL. A negative result does not preclude SARS-Cov-2 infection and should not be used as the sole basis for treatment or other patient management decisions. A negative result may occur with  improper specimen collection/handling, submission of specimen other than nasopharyngeal swab, presence of viral mutation(s) within the areas targeted by this assay, and inadequate number of viral copies (<131 copies/mL). A negative result must be combined with clinical observations,  patient history, and epidemiological information. The expected result is Negative.  Fact Sheet for Patients:  https://www.moore.com/  Fact Sheet for Healthcare Providers:  https://www.young.biz/  This test is no t yet approved or cleared by the [place] FDA and  has been authorized for detection and/or diagnosis of SARS-CoV-2 by FDA under an Emergency Use Authorization (EUA). This EUA will remain  in effect (meaning this test can be used) for the duration of the COVID-19 declaration under Section 564(b)(1) of the Act, 21 U.S.C. section 360bbb-3(b)(1), unless the authorization is terminated or revoked sooner.     Influenza A by PCR NEGATIVE NEGATIVE Final   Influenza B by PCR NEGATIVE NEGATIVE Final    Comment: (NOTE) The Xpert Xpress SARS-CoV-2/FLU/RSV assay is intended as an aid in  the diagnosis of influenza from Nasopharyngeal swab specimens and  should not be used as a sole basis for treatment. Nasal washings and  aspirates are unacceptable for Xpert Xpress SARS-CoV-2/FLU/RSV  testing.  Fact Sheet for Patients: https://www.moore.com/  Fact Sheet for Healthcare Providers: https://www.young.biz/  This test is not yet approved or cleared by the [place] FDA and  has been authorized for detection and/or diagnosis of SARS-CoV-2 by  FDA under an Emergency Use Authorization (EUA). This EUA will remain  in effect (meaning this test can be used) for the duration of the  Covid-19 declaration under Section 564(b)(1) of the Act, 21  U.S.C. section 360bbb-3(b)(1), unless the authorization is  terminated or revoked. Performed at [facility], [address] [place], [place] [address]          Radiology Studies: DG Abd 1 View  Result Date: 10/03/2020 CLINICAL DATA:  Small bowel obstruction. EXAM: ABDOMEN - 1 VIEW COMPARISON:  October 01, 2020. FINDINGS: Mildly dilated small bowel loops  are noted in the left upper quadrant the abdomen concerning for ileus or possibly distal small bowel obstruction. This is mildly more prominent compared to prior exam. No radio-opaque calculi or other significant radiographic abnormality are seen. IMPRESSION: Mildly dilated small bowel loops are noted in left upper quadrant the abdomen concerning for ileus or possibly distal small bowel obstruction. This is mildly more prominent compared to prior exam. Electronically Signed   By: Lupita Raider M.D.   On: 10/03/2020 08:35        Scheduled Meds: . potassium chloride  20 mEq Oral TID PC   Continuous Infusions: . sodium chloride 75 mL/hr at 10/03/20 1605     LOS: 3 days      Thalia Party, MD [facility] Pager [phone]  If 7PM-7AM, please contact night-coverage [email or web address] Password [facility] 10/03/2020, 4:52 PM

## 2020-10-04 LAB — CBC
HCT: 42.6 % (ref 39.0–52.0)
Hemoglobin: 14.1 g/dL (ref 13.0–17.0)
MCH: 28.3 pg (ref 26.0–34.0)
MCHC: 33.1 g/dL (ref 30.0–36.0)
MCV: 85.5 fL (ref 80.0–100.0)
Platelets: 185 10*3/uL (ref 150–400)
RBC: 4.98 MIL/uL (ref 4.22–5.81)
RDW: 12.5 % (ref 11.5–15.5)
WBC: 6.1 10*3/uL (ref 4.0–10.5)
nRBC: 0 % (ref 0.0–0.2)

## 2020-10-04 LAB — BASIC METABOLIC PANEL
Anion gap: 10 (ref 5–15)
BUN: 6 mg/dL (ref 6–20)
CO2: 27 mmol/L (ref 22–32)
Calcium: 8 mg/dL — ABNORMAL LOW (ref 8.9–10.3)
Chloride: 102 mmol/L (ref 98–111)
Creatinine, Ser: 0.95 mg/dL (ref 0.61–1.24)
GFR, Estimated: >60 mL/min (ref >60)
Glucose, Bld: 108 mg/dL — ABNORMAL HIGH (ref 70–99)
Potassium: 3.9 mmol/L (ref 3.5–5.1)
Sodium: 139 mmol/L (ref 135–145)

## 2020-10-04 LAB — MAGNESIUM: Magnesium: 1.9 mg/dL (ref 1.7–2.4)

## 2020-10-04 NOTE — Progress Notes (Signed)
Eduard Clos to be D/C'd per MD order.   VSS, Skin clean, dry, and intact.  IV catheter discontinued intact. Site without signs and symptoms of complications.   An After Visit Summary was printed and given to the patient/caregiver.  Patient to be escorted via WC, and D/C to group home via private auto.

## 2020-10-04 NOTE — Discharge Summary (Signed)
Discharge Summary  Vincent Singh:[id number] DOB: 1976/09/17  PCP: Nelida Meuse, MD  Admit date: 09/30/2020 Discharge date: 10/04/2020  Time spent: , more than 50% time spent on coordination of care.  Mother updated over the phone, case discussed with general surgery.  Recommendations for Outpatient Follow-up:  1. F/u with PCP at group home for hospital discharge follow up, repeat cbc/bmp at follow up 2. F/u with general surgery on 11/1   Discharge Diagnoses:  Active Hospital Problems   Diagnosis Date Noted  . Incarcerated right inguinal hernia 10/01/2020  . AKI (acute kidney injury) (HCC) 10/01/2020  . Incarcerated hernia 10/01/2020    Resolved Hospital Problems  No resolved problems to display.    Discharge Condition: stable  Diet recommendation: Regular diet  Filed Weights   09/30/20 1531  Weight: 85.3 kg    History of present illness: (Per admitting MD Dr. Toniann Fail) Patient coming from: Group home.  Chief Complaint: Nausea vomiting.  HPI: Vincent Singh is a 44 y.o. male with history of mentally challenged has been experiencing nausea vomiting for the last 3 to 4 days.  Denies any abdominal pain.  Does not recall his last bowel movement.  Due to persistent symptoms patient presents to the ER.  ED Course: In the ER CT abdomen and pelvis shows right inguinal hernia which was incarcerated and on-call general surgery was consulted.  The hernia was reduced and patient is being admitted for further observation.  Labs also show acute renal failure with hyponatremia.  Patient was started on fluids.  Covid test is negative.  Patient's LFTs are elevated with AST of 47 and ALT of 153.  Blood glucose 160.   Hospital Course:  Principal Problem:   Incarcerated right inguinal hernia Active Problems:   AKI (acute kidney injury) (HCC)   Incarcerated hernia   Incarcerated right inguinal hernia with bowel obstruction present on admission CT abdomen/pelvis  showed right inguinal hernia which was incarcerated and hernia was reduced by general surgery in the emergency room He gradually improved after reduction of hernia, he is tolerating diet advancement , having bowel movement , denies pain at discharge .  C. difficile testing negative  -he is cleared to discharge home and close follow-up with general surgery  Acute renal failure present on admission, resolved Patient had BUN 80 and creatinine level of 2.88 on arrival to the hospital UA no bacteria He received hydration, BUN 6 creatinine 0.95 at discharge  Hyponatremia, resolved -Likely due to dehydration -Sodium 126 on presentation, he received hydration ,sodium normalized at 139 at discharge  Impaired fasting blood glucose -Initially on presentation, possibly due to acute distress, fasting blood glucose 124 on October 6, fasting blood glucose 108 at discharge -A1c 5 Follow-up with PCP  Elevated liver enzymes -Total bilirubin 1.7, AST 47, ALT 155 on presentation, lipase normal at 21 -LFT improved on repeat, AST normalized to 21, ALT decreased to 78, total bilirubin normalized to 0.9 -Abdominal ultrasound showed "decompressed gallbladder with multiple gallstones within. Fatty liver" -f/u with pcp  Mental retardation Patientat his baseline and not showing any signs of agitation or anxiety  Procedures:  Manual reduction of right inguinal hernia by general surgery Dr. Doylene Canard on October 5  Consultations:  General surgery  Discharge Exam: BP 130/80 (BP Location: Left Arm)   Pulse 99   Temp 98.1 F (36.7 C) (Axillary)   Resp 18   Ht 6\' 3"  (1.905 m)   Wt 85.3 kg   SpO2 100%  BMI 23.50 kg/m   General: NAD, interactive, not oriented to time, knowing he is in the hospital but not able to name it, likely baseline mental retardation Cardiovascular: RRR Respiratory: Clear to auscultation bilaterally  Discharge Instructions You were cared for by a hospitalist during your  hospital stay. If you have any questions about your discharge medications or the care you received while you were in the hospital after you are discharged, you can call the unit and asked to speak with the hospitalist on call if the hospitalist that took care of you is not available. Once you are discharged, your primary care physician will handle any further medical issues. Please note that NO REFILLS for any discharge medications will be authorized once you are discharged, as it is imperative that you return to your primary care physician (or establish a relationship with a primary care physician if you do not have one) for your aftercare needs so that they can reassess your need for medications and monitor your lab values.  Discharge Instructions    Diet general   Complete by: As directed    Increase activity slowly   Complete by: As directed      Allergies as of 10/04/2020   No Known Allergies     Medication List    TAKE these medications   ergocalciferol 1.25 MG (50000 UT) capsule Commonly known as: VITAMIN D2 Take 50,000 Units by mouth once a week. Tuesdays   multivitamin with minerals Tabs tablet Take 1 tablet by mouth daily.   ondansetron 4 MG disintegrating tablet Commonly known as: ZOFRAN-ODT Take 4 mg by mouth every 8 (eight) hours as needed for nausea or vomiting.      No Known Allergies  Follow-up Information    Manus Rudd, MD Follow up on 10/27/2020.   Specialty: General Surgery Why: Your appointment is at 9:30 AM.  Be at the office 30 minute early for check in.  Bring photo ID, family or someone from the home that sign for him, insurance information with you.   Contact information: [address] [place] [address] [phone]        follow up with pcp to repeat cbc/bmp Follow up in 1 week(s).                The results of significant diagnostics from this hospitalization (including imaging, microbiology, ancillary and laboratory) are  listed below for reference.    Significant Diagnostic Studies: CT Abdomen Pelvis Wo Contrast  Result Date: 09/30/2020 CLINICAL DATA:  Nausea and vomiting EXAM: CT ABDOMEN AND PELVIS WITHOUT CONTRAST TECHNIQUE: Multidetector CT imaging of the abdomen and pelvis was performed following the standard protocol without IV contrast. COMPARISON:  Ultrasound from earlier in the same day. FINDINGS: Lower chest: No acute abnormality. Hepatobiliary: Liver is within normal limits. Gallbladder is contracted. Known gallstones are not well appreciated on this exam. Pancreas: Unremarkable. No pancreatic ductal dilatation or surrounding inflammatory changes. Spleen: Normal in size without focal abnormality. Adrenals/Urinary Tract: Adrenal glands are within normal limits. Kidneys demonstrate no renal calculi or urinary tract obstructive changes. Stomach/Bowel: Scattered diverticular change of the colon is noted. No evidence of diverticulitis is seen. The appendix is within normal limits. Terminal ileum is unremarkable. In the mid to distal ileum there is evidence of a right inguinal hernia containing a loop of distal small bowel causing proximal small bowel obstruction. Fluid is noted within the distal esophagus consistent with reflux. The stomach is otherwise within normal limits. Vascular/Lymphatic:  No significant vascular findings are present. No enlarged abdominal or pelvic lymph nodes. Reproductive: Prostate is unremarkable. Other: No abdominal ascites is seen. Right inguinal hernia is noted as described above containing a loop of incarcerated small bowel causing proximal small-bowel obstruction involving the jejunum and proximal ileum. Musculoskeletal: No acute or significant osseous findings. IMPRESSION: Small-bowel obstruction in the mid to distal ileum secondary to an incarcerated right inguinal hernia. Previously seen cholelithiasis is not well appreciated. No other focal abnormality is noted. Electronically Signed    By: Alcide CleverMark  Lukens M.D.   On: 09/30/2020 20:25   DG Abd 1 View  Result Date: 10/03/2020 CLINICAL DATA:  Small bowel obstruction. EXAM: ABDOMEN - 1 VIEW COMPARISON:  October 01, 2020. FINDINGS: Mildly dilated small bowel loops are noted in the left upper quadrant the abdomen concerning for ileus or possibly distal small bowel obstruction. This is mildly more prominent compared to prior exam. No radio-opaque calculi or other significant radiographic abnormality are seen. IMPRESSION: Mildly dilated small bowel loops are noted in left upper quadrant the abdomen concerning for ileus or possibly distal small bowel obstruction. This is mildly more prominent compared to prior exam. Electronically Signed   By: Lupita RaiderJames  Green Jr M.D.   On: 10/03/2020 08:35   DG Abdomen 1 View  Result Date: 09/30/2020 CLINICAL DATA:  Constipation vomiting EXAM: ABDOMEN - 1 VIEW COMPARISON:  None. FINDINGS: Dilated small bowel loops in the central abdomen up to 4.6 cm. Some scattered colon gas but relative paucity. No radiopaque calculi. IMPRESSION: Dilated small bowel loops in the central abdomen, appearance is concerning for small bowel obstruction. Electronically Signed   By: Jasmine PangKim  Fujinaga M.D.   On: 09/30/2020 19:24   US Abdomen Limited  Result Date: 09/30/2020 CLINICAL DATA:  Right upper quadrant pain with elevated LFTs EXAM: ULTRASOUND ABDOMEN LIMITED RIGHT UPPER QUADRANT COMPARISON:  None. FINDINGS: Gallbladder: Decompressed with multiple gallstones within. No pericholecystic fluid is noted. Negative sonographic Murphy's sign is elicited. Common bile duct: Diameter: 3 mm. Liver: Mild increased echogenicity is noted consistent with fatty infiltration. No focal mass is seen. Portal vein is patent on color Doppler imaging with normal direction of blood flow towards the liver. Other: None. IMPRESSION: Decompressed gallbladder with multiple gallstones within. Fatty liver. No acute abnormality noted. Electronically Signed   By: Alcide CleverMark   Lukens M.D.   On: 09/30/2020 20:00   DG Abd Portable 1V  Result Date: 10/01/2020 CLINICAL DATA:  Small bowel obstruction. EXAM: PORTABLE ABDOMEN - 1 VIEW COMPARISON:  September 30, 2020. FINDINGS: Mildly dilated small bowel loop is noted in left upper quadrant which is improved compared to prior exam. No free air is noted on decubitus view. No radio-opaque calculi or other significant radiographic abnormality are seen. IMPRESSION: Decreased small bowel dilatation is noted compared to prior exam. Electronically Signed   By: Lupita RaiderJames  Green Jr M.D.   On: 10/01/2020 13:52    Microbiology: Recent Results (from the past 240 hour(s))  Respiratory Panel by RT PCR (Flu A&B, Covid) - Nasopharyngeal Swab     Status: None   Collection Time: 09/30/20  6:48 PM   Specimen: Nasopharyngeal Swab  Result Value Ref Range Status   SARS Coronavirus 2 by RT PCR NEGATIVE NEGATIVE Final    Comment: (NOTE) SARS-CoV-2 target nucleic acids are NOT DETECTED.  The SARS-CoV-2 RNA is generally detectable in upper respiratoy specimens during the acute phase of infection. The lowest concentration of SARS-CoV-2 viral copies this assay can detect is 131 copies/mL. A negative  result does not preclude SARS-Cov-2 infection and should not be used as the sole basis for treatment or other patient management decisions. A negative result may occur with  improper specimen collection/handling, submission of specimen other than nasopharyngeal swab, presence of viral mutation(s) within the areas targeted by this assay, and inadequate number of viral copies (<131 copies/mL). A negative result must be combined with clinical observations, patient history, and epidemiological information. The expected result is Negative.  Fact Sheet for Patients:  https://www.moore.com/  Fact Sheet for Healthcare Providers:  https://www.young.biz/  This test is no t yet approved or cleared by the [place] FDA  and  has been authorized for detection and/or diagnosis of SARS-CoV-2 by FDA under an Emergency Use Authorization (EUA). This EUA will remain  in effect (meaning this test can be used) for the duration of the COVID-19 declaration under Section 564(b)(1) of the Act, 21 U.S.C. section 360bbb-3(b)(1), unless the authorization is terminated or revoked sooner.     Influenza A by PCR NEGATIVE NEGATIVE Final   Influenza B by PCR NEGATIVE NEGATIVE Final    Comment: (NOTE) The Xpert Xpress SARS-CoV-2/FLU/RSV assay is intended as an aid in  the diagnosis of influenza from Nasopharyngeal swab specimens and  should not be used as a sole basis for treatment. Nasal washings and  aspirates are unacceptable for Xpert Xpress SARS-CoV-2/FLU/RSV  testing.  Fact Sheet for Patients: https://www.moore.com/  Fact Sheet for Healthcare Providers: https://www.young.biz/  This test is not yet approved or cleared by the [place] FDA and  has been authorized for detection and/or diagnosis of SARS-CoV-2 by  FDA under an Emergency Use Authorization (EUA). This EUA will remain  in effect (meaning this test can be used) for the duration of the  Covid-19 declaration under Section 564(b)(1) of the Act, 21  U.S.C. section 360bbb-3(b)(1), unless the authorization is  terminated or revoked. Performed at [facility], [address], [place], [place] [address]   C Difficile Quick Screen w PCR reflex     Status: None   Collection Time: 10/03/20  4:03 PM   Specimen: STOOL  Result Value Ref Range Status   C Diff antigen NEGATIVE NEGATIVE Final   C Diff toxin NEGATIVE NEGATIVE Final   C Diff interpretation No C. difficile detected.  Final    Comment: Performed at [facility], [address], [place], [place] [address]     Labs: Basic Metabolic Panel: Recent Labs  Lab 09/30/20 1535 10/01/20 0435 10/02/20 0117 10/03/20 0021 10/04/20 0129  NA 126* 129*  131* 133* 139  K 3.6 3.4* 3.7 3.5 3.9  CL 84* 93* 96* 97* 102  CO2 21* GLUCOSE 160* 124* 104* 102* 108*  BUN 80* 63* 24* 10 6  CREATININE 2.88* 1.71* 1.23 1.02 0.95  CALCIUM 9.3 7.7* 7.9* 7.8* 8.0*  MG  --   --   --   --  1.9   Liver Function Tests: Recent Labs  Lab 09/30/20 1535 10/02/20 0117  AST 47* 21  ALT 153* 78*  ALKPHOS 56 37*  BILITOT 1.7* 0.9  PROT 8.7* 5.7*  ALBUMIN 4.8 3.0*   Recent Labs  Lab 09/30/20 1535  LIPASE 21   No results for input(s): AMMONIA in the last 168 hours. CBC: Recent Labs  Lab 09/30/20 1535 10/01/20 0435 10/02/20 0117 10/03/20 0021 10/04/20 0129  WBC 9.1 8.3 7.1 6.9 6.1  HGB 20.5* 16.7 15.1 13.7 14.1  HCT 57.3* 47.3 45.2  40.5 42.6  MCV 81.0 80.9 83.9 84.2 85.5  PLT 265 190 194 172 185   Cardiac Enzymes: No results for input(s): CKTOTAL, CKMB, CKMBINDEX, TROPONINI in the last 168 hours. BNP: BNP (last 3 results) No results for input(s): BNP in the last 8760 hours.  ProBNP (last 3 results) No results for input(s): PROBNP in the last 8760 hours.  CBG: No results for input(s): GLUCAP in the last 168 hours.     Signed:  Albertine Grates MD, PhD, FACP  [facility] 10/04/2020, 11:40 AM

## 2020-10-04 NOTE — Progress Notes (Signed)
    CC: Nausea and vomiting  Subjective: He is sitting up in bed he is eating his complete breakfast.  He is having bowel movements without difficulty, no abdominal distention abdominal pain, his hernia remains reduced.  C. difficile is negative, no further nausea or vomiting. Objective: Vital signs in last 24 hours: Temp:  [97.6 F (36.4 C)-98.1 F (36.7 C)] 98.1 F (36.7 C) (10/09 0350) Pulse Rate:  [98-100] 99 (10/09 0350) Resp:  [18] 18 (10/09 0350) BP: (130-131)/(80-83) 130/80 (10/09 0350) SpO2:  [100 %] 100 % (10/09 0350) Last BM Date: 10/02/20 1100 p.o. 2150 urine BM x3 Afebrile vital signs are stable Potassium is 3.9 glucose 108 remaining BMP is normal.  Magnesium 1.9 WBC 6.9 H/H 14.1/42.6: C. difficile is negative Intake/Output from previous day: 10/08 0701 - 10/09 0700 In: 1100 [P.O.:1100] Out: 2150 [Urine:2150] Intake/Output this shift: No intake/output data recorded.  General appearance: alert, cooperative and no distress Resp: clear to auscultation bilaterally GI: soft, non-tender; bowel sounds normal; no masses,  no organomegaly and Hernia remains reduced.  Lab Results:  Recent Labs    10/03/20 0021 10/04/20 0129  WBC 6.9 6.1  HGB 13.7 14.1  HCT 40.5 42.6  PLT 172 185    BMET Recent Labs    10/03/20 0021 10/04/20 0129  NA 133* 139  K 3.5 3.9  CL 97* 102  CO2 29 27  GLUCOSE 102* 108*  BUN 10 6  CREATININE 1.02 0.95  CALCIUM 7.8* 8.0*   PT/INR No results for input(s): LABPROT, INR in the last 72 hours.  Recent Labs  Lab 09/30/20 1535 10/02/20 0117  AST 47* 21  ALT 153* 78*  ALKPHOS 56 37*  BILITOT 1.7* 0.9  PROT 8.7* 5.7*  ALBUMIN 4.8 3.0*     Lipase     Component Value Date/Time   LIPASE 21 09/30/2020 1535     Medications: . potassium chloride  20 mEq Oral TID PC    Assessment/Plan Developmental delay/group home AKI/dehydration -Creatinine 2.88>>1.71>>1.23>>1.02 -H/H  20.5/57.3>>16.7/47.3>>15.1/45.2>>13.7/40.5 -WBC 9.1>>8.3>>7.1 Elevated LFT's -Alkaline phos 56>>37 -AST 47>>21 -ALT 153>>78 Total bilirubin 1.7>>0.9 HOH - signs and speaks Hypokalemia K+ 3.5 >> replace to K+ 4.0 range  Nausea and vomiting Incarcerated right inguinal hernia-reduced Hx RIH with Mesh 02/28/14 Dr. [name]  FEN: IV fluids/clear ID: None DVT: SCDs Follow-up: TBD   Plan: From our standpoint he can go home.  Follow-up is in the AVS, I discussed the plan with his mother 2 days ago.  Please call if we can be of further assistance.  LOS: 4 days    Marcia Hartwell 10/04/2020 Please see Amion

## 2020-10-27 ENCOUNTER — Ambulatory Visit: Payer: Self-pay | Admitting: Surgery

## 2020-10-28 ENCOUNTER — Ambulatory Visit: Payer: Self-pay | Admitting: Surgery

## 2020-10-28 NOTE — H&P (Signed)
History of Present Illness Vincent Singh. Vincent Strole MD; 10/28/2020 2:27 PM) The patient is a 44 year old male who presents with an inguinal hernia. This is a 44 year old male with special needs who resides in a group home. He is accompanied by his father. He is s/p right inguinal hernia repair with mesh in 2015. He had been doing well until recently when he presented to the ED with nausea, vomiting, and right groin pain. He was noted to have a recurrent right inguinal hernia with some incarcerated bowel. The hernia was reduced and his SBO resolved. He presents now to discuss repair.  CLINICAL DATA: Nausea and vomiting  EXAM: CT ABDOMEN AND PELVIS WITHOUT CONTRAST  TECHNIQUE: Multidetector CT imaging of the abdomen and pelvis was performed following the standard protocol without IV contrast.  COMPARISON: Ultrasound from earlier in the same day.  FINDINGS: Lower chest: No acute abnormality.  Hepatobiliary: Liver is within normal limits. Gallbladder is contracted. Known gallstones are not well appreciated on this exam.  Pancreas: Unremarkable. No pancreatic ductal dilatation or surrounding inflammatory changes.  Spleen: Normal in size without focal abnormality.  Adrenals/Urinary Tract: Adrenal glands are within normal limits. Kidneys demonstrate no renal calculi or urinary tract obstructive changes.  Stomach/Bowel: Scattered diverticular change of the colon is noted. No evidence of diverticulitis is seen. The appendix is within normal limits. Terminal ileum is unremarkable. In the mid to distal ileum there is evidence of a right inguinal hernia containing a loop of distal small bowel causing proximal small bowel obstruction. Fluid is noted within the distal esophagus consistent with reflux. The stomach is otherwise within normal limits.  Vascular/Lymphatic: No significant vascular findings are present. No enlarged abdominal or pelvic lymph nodes.  Reproductive: Prostate is  unremarkable.  Other: No abdominal ascites is seen. Right inguinal hernia is noted as described above containing a loop of incarcerated small bowel causing proximal small-bowel obstruction involving the jejunum and proximal ileum.  Musculoskeletal: No acute or significant osseous findings.  IMPRESSION: Small-bowel obstruction in the mid to distal ileum secondary to an incarcerated right inguinal hernia.  Previously seen cholelithiasis is not well appreciated.  No other focal abnormality is noted.   Electronically Signed By: Alcide Clever M.D. On: 09/30/2020 20:25   Past Surgical History Vincent Singh, CMA; 10/27/2020 9:34 AM) Oral Surgery  Diagnostic Studies History Vincent Singh, CMA; 10/27/2020 9:34 AM) Colonoscopy never  Allergies Vincent Singh, CMA; 10/27/2020 9:35 AM) No Known Drug Allergies [10/27/2020]: Allergies Reconciled  Medication History Vincent Singh, CMA; 10/27/2020 9:36 AM) Multiple Vitamin (Oral) Active. Medications Reconciled  Social History Vincent Singh, CMA; 10/27/2020 9:34 AM) Caffeine use Coffee. No alcohol use No drug use Tobacco use Never smoker.  Family History Vincent Singh, CMA; 10/27/2020 9:34 AM) Breast Cancer Mother. Prostate Cancer Father.  Other Problems Vincent Singh, CMA; 10/27/2020 9:34 AM) Inguinal Hernia     Review of Systems Vincent Singh CMA; 10/27/2020 9:34 AM) General Not Present- Appetite Loss, Chills, Fatigue, Fever, Night Sweats, Weight Gain and Weight Loss. Skin Not Present- Change in Wart/Mole, Dryness, Hives, Jaundice, New Lesions, Non-Healing Wounds, Rash and Ulcer. HEENT Not Present- Earache, Hearing Loss, Hoarseness, Nose Bleed, Oral Ulcers, Ringing in the Ears, Seasonal Allergies, Sinus Pain, Sore Throat, Visual Disturbances, Wears glasses/contact lenses and Yellow Eyes. Respiratory Not Present- Bloody sputum, Chronic Cough, Difficulty Breathing, Snoring and Wheezing. Breast Not Present- Breast  Mass, Breast Pain, Nipple Discharge and Skin Changes. Cardiovascular Not Present- Chest Pain, Difficulty Breathing Lying Down, Leg Cramps, Palpitations, Rapid Heart  Rate, Shortness of Breath and Swelling of Extremities. Male Genitourinary Not Present- Blood in Urine, Change in Urinary Stream, Frequency, Impotence, Nocturia, Painful Urination, Urgency and Urine Leakage. Musculoskeletal Not Present- Back Pain, Joint Pain, Joint Stiffness, Muscle Pain, Muscle Weakness and Swelling of Extremities. Neurological Not Present- Decreased Memory, Fainting, Headaches, Numbness, Seizures, Tingling, Tremor, Trouble walking and Weakness. Psychiatric Not Present- Anxiety, Bipolar, Change in Sleep Pattern, Depression, Fearful and Frequent crying. Endocrine Not Present- Cold Intolerance, Excessive Hunger, Hair Changes, Heat Intolerance, Hot flashes and New Diabetes. Hematology Not Present- Blood Thinners, Easy Bruising, Excessive bleeding, Gland problems, HIV and Persistent Infections.  Vitals Vincent Singh CMA; 10/27/2020 9:36 AM) 10/27/2020 9:36 AM Weight: 188 lb Height: 73in Body Surface Area: 2.1 m Body Mass Index: 24.8 kg/m  Temp.: 98.69F(Tympanic)  Pulse: 116 (Regular)  BP: 124/72(Sitting, Left Arm, Standard)        Physical Exam [name] [name] MD; 10/28/2020 7:39 AM)  The physical exam findings are as follows: Note:Constitutional: WDWN in NAD, conversant, no obvious deformities; resting comfortably Eyes: Pupils equal, round; sclera anicteric; moist conjunctiva; no lid lag HENT: Oral mucosa moist; good dentition Neck: No masses palpated, trachea midline; no thyromegaly Lungs: CTA bilaterally; normal respiratory effort CV: Regular rate and rhythm; no murmurs; extremities well-perfused with no edema Abd: +bowel sounds, soft, non-tender, no palpable organomegaly; GU: Right inguinal hernia - spontaneously reducible; no testicular swelling; no LIH Musc: Normal gait; no apparent  clubbing or cyanosis in extremities Lymphatic: No palpable cervical or axillary lymphadenopathy Skin: Warm, dry; no sign of jaundice Psychiatric - alert and oriented x 4; calm mood and affect    Assessment & Plan [name] K. [name] MD; 10/27/2020 10:04 PM)  RECURRENT RIGHT INGUINAL HERNIA (K40.91)  Current Plans Schedule for Surgery - Open repair of recurrent right inguinal hernia with mesh. The surgical procedure has been discussed with the patient. Potential risks, benefits, alternative treatments, and expected outcomes have been explained. All of the patient's questions at this time have been answered. The likelihood of reaching the patient's treatment goal is good. The patient understand the proposed surgical procedure and wishes to proceed.  Vincent Singh. [name], MD, [facility] Surgery  General/ Trauma Surgery   10/28/2020 2:27 PM

## 2020-12-05 ENCOUNTER — Other Ambulatory Visit (HOSPITAL_COMMUNITY)
Admission: RE | Admit: 2020-12-05 | Discharge: 2020-12-05 | Disposition: A | Discharge status: Home / Self Care | Payer: Medicaid Other | Source: Ambulatory Visit | Attending: Surgery | Admitting: Surgery

## 2020-12-05 ENCOUNTER — Encounter (HOSPITAL_COMMUNITY): Payer: Self-pay | Admitting: Surgery

## 2020-12-05 DIAGNOSIS — Z20822 Contact with and (suspected) exposure to covid-19: Secondary | ICD-10-CM | POA: Insufficient documentation

## 2020-12-05 DIAGNOSIS — Z01812 Encounter for preprocedural laboratory examination: Secondary | ICD-10-CM | POA: Insufficient documentation

## 2020-12-05 LAB — SARS CORONAVIRUS 2 (TAT 6-24 HRS): SARS Coronavirus 2: NEGATIVE

## 2020-12-05 NOTE — Progress Notes (Signed)
Spoke with [name] from [facility] cell [phone] to obtain PAT information.  Patient does not have SOB, fever, cough or chest pain.  PCP - Elizabeth Palau, NP Cardiologist - n/a  Chest x-ray - n/a EKG - n/a Stress Test - n/a ECHO - n/a Cardiac Cath - n/a  Anesthesia review: Yes  Clear liquids til 10 am DOS.  STOP now taking any Aspirin (unless otherwise instructed by your surgeon), Aleve, Naproxen, Ibuprofen, Motrin, Advil, Goody's, BC's, all herbal medications, fish oil, and all vitamins.   Coronavirus Screening Covid test on 12/05/20 is neative.   Patient verbalized understanding of instructions that were given via phone.

## 2020-12-08 ENCOUNTER — Other Ambulatory Visit: Payer: Self-pay

## 2020-12-08 ENCOUNTER — Encounter (HOSPITAL_COMMUNITY): Payer: Self-pay | Admitting: Surgery

## 2020-12-08 NOTE — Anesthesia Preprocedure Evaluation (Addendum)
Anesthesia Evaluation  Patient identified by MRN, date of birth, ID band Patient awake    Reviewed: Allergy & Precautions, H&P , NPO status , Patient's Chart, lab work & pertinent test results  Airway Mallampati: II  TM Distance: >3 FB Neck ROM: Full    Dental no notable dental hx. (+) Teeth Intact, Dental Advisory Given   Pulmonary neg pulmonary ROS,    Pulmonary exam normal breath sounds clear to auscultation       Cardiovascular negative cardio ROS   Rhythm:Regular Rate:Normal     Neuro/Psych Anxiety negative neurological ROS  negative psych ROS   GI/Hepatic negative GI ROS, Neg liver ROS,   Endo/Other  negative endocrine ROS  Renal/GU negative Renal ROS  negative genitourinary   Musculoskeletal   Abdominal   Peds  Hematology negative hematology ROS (+)   Anesthesia Other Findings   Reproductive/Obstetrics negative OB ROS                            Anesthesia Physical Anesthesia Plan  ASA: II  Anesthesia Plan: General   Post-op Pain Management:  Regional for Post-op pain   Induction: Intravenous  PONV Risk Score and Plan: 3 and Ondansetron, Dexamethasone and Midazolam  Airway Management Planned: LMA and Oral ETT  Additional Equipment:   Intra-op Plan:   Post-operative Plan: Extubation in OR  Informed Consent: I have reviewed the patients History and Physical, chart, labs and discussed the procedure including the risks, benefits and alternatives for the proposed anesthesia with the patient or authorized representative who has indicated his/her understanding and acceptance.     Dental advisory given  Plan Discussed with: CRNA  Anesthesia Plan Comments: (PAT note written 12/08/2020 by Shonna Chock, PA-C. )       Anesthesia Quick Evaluation

## 2020-12-08 NOTE — Progress Notes (Signed)
Anesthesia Chart Review:  Case: [id number] Date/Time: 12/09/20 1244   Procedure: OPEN REPAIR OF RECURRENT RIGHT INGUINAL HERNIA WITH MESH (Right )   Anesthesia type: General   Pre-op diagnosis: RECURRENT RIGHT INGUINAL HERNIA   Location: [facility] ROOM 09 / [facility]   Surgeons: Manus Rudd, MD      DISCUSSION: Patient is a 44 year old male scheduled for the above procedure.  History includes never smoker, anxiety, fatty liver, intellectual disability (lives in group home), anxiety (including with surgery, needles), hyperactive gag reflex, right inguinal hernia (s/p open right hernia repair 02/28/14).  - Admission to [facility] 09/30/20-10/04/20 with SBO due to incarcerated right inguinal hernia with N/V, AKI, elevated LFTs, and hyponatremia.  General surgery was able to reduce the hernia.  Symptoms improved with reduction of hernia and hydration.  Abdominal ultrasound showed "decompressed gallbladder with multiple gallstones within.  Fatty liver" and advised PCP follow-up.  Outpatient general surgery follow-up plan for right inguinal hernia.  12/05/2020 presurgical COVID-19 test negative.  He is a same-day work-up so anesthesia team to evaluate on the day of surgery.   VS: Ht 6' (1.829 m)   Wt 88.5 kg   BMI 26.45 kg/m   BP Readings from Last 3 Encounters:  10/04/20 130/80  04/15/14 122/70  03/18/14 124/74   Pulse Readings from Last 3 Encounters:  10/04/20 99  04/15/14 72  03/18/14 76    PROVIDERS: Nelida Meuse, MD his listed as PCP   LABS: For day of surgery.  As of October 04, 2020, creatinine 0.95, glucose 108, CBC within normal limits.  LFTs on 10/02/2020 showed an AST of 21 and a decreasing ALT of 78 (previously 153 on 09/30/20).  A1c 5.0 on 10/01/2020.  IMAGES: CT Abd/pelvis 09/30/20: IMPRESSION: - Small-bowel obstruction in the mid to distal ileum secondary to an incarcerated right inguinal hernia. - Previously seen cholelithiasis is not well appreciated. - No other focal  abnormality is noted.   EKG: N/A   CV: N/A  Past Medical History:  Diagnosis Date  . Anxiety    ANXIETY OVER SURGERY AND NEEDLES- TALKS A LOT WHEN ANXIOUS  . Complication of anesthesia    SOME AGGITATION WAKING UP AFTER WISDOM TEETH EXTRACTED  . Fatty liver   . Hyperactive gag reflex    DOES NOT TOLERATE ORAL THERMOMETER  . Inguinal hernia    RIGHT INGUINAL HERNIA - CAUSED PAIN ABOUT 3 WEEKS   . 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 HIS MOTHER / HCPOA  JANE Osei WITH HIM TODAY AT [facility] PREOP AND ANSWERED MEDICAL HX QUESTIONS.  . SBO (small bowel obstruction) (HCC) 10/01/2020    Past Surgical History:  Procedure Laterality Date  . HERNIA REPAIR    . INGUINAL HERNIA REPAIR Right 02/28/2014   Procedure: HERNIA REPAIR INGUINAL ADULT;  Surgeon: [name]. [name], MD;  Location: [facility] ORS;  Service: General;  Laterality: Right;  . INSERTION OF MESH Right 02/28/2014   Procedure: INSERTION OF MESH;  Surgeon: [name]. [name], MD;  Location: [facility] ORS;  Service: General;  Laterality: Right;  . WISDOM TEETH EXTRACTIONS - 20 YRS AGO      MEDICATIONS: No current facility-administered medications for this encounter.   . ergocalciferol (VITAMIN D2) 1.25 MG (50000 UT) capsule  . Multiple Vitamin (MULTIVITAMIN WITH MINERALS) TABS tablet  . Polyethyl Glycol-Propyl Glycol (SYSTANE) 0.4-0.3 % SOLN    Shonna Chock,  PA-C Surgical Short Stay/Anesthesiology [facility] Phone [phone] [facility] Phone [phone] 12/08/2020 3:51 PM

## 2020-12-09 ENCOUNTER — Ambulatory Visit (HOSPITAL_COMMUNITY)
Admission: RE | Admit: 2020-12-09 | Discharge: 2020-12-09 | Disposition: A | Discharge status: Home / Self Care | Payer: Medicaid Other | Attending: Surgery | Admitting: Surgery

## 2020-12-09 ENCOUNTER — Encounter (HOSPITAL_COMMUNITY): Payer: Self-pay | Admitting: Surgery

## 2020-12-09 ENCOUNTER — Encounter (HOSPITAL_COMMUNITY)
Admission: RE | Disposition: A | Discharge status: Home / Self Care | Payer: Self-pay | Source: Home / Self Care | Attending: Surgery

## 2020-12-09 ENCOUNTER — Ambulatory Visit (HOSPITAL_COMMUNITY): Payer: Medicaid Other | Admitting: Vascular Surgery

## 2020-12-09 ENCOUNTER — Other Ambulatory Visit: Payer: Self-pay

## 2020-12-09 DIAGNOSIS — Z803 Family history of malignant neoplasm of breast: Secondary | ICD-10-CM | POA: Insufficient documentation

## 2020-12-09 DIAGNOSIS — Z79899 Other long term (current) drug therapy: Secondary | ICD-10-CM | POA: Diagnosis not present

## 2020-12-09 DIAGNOSIS — K4031 Unilateral inguinal hernia, with obstruction, without gangrene, recurrent: Secondary | ICD-10-CM | POA: Diagnosis present

## 2020-12-09 DIAGNOSIS — Z8042 Family history of malignant neoplasm of prostate: Secondary | ICD-10-CM | POA: Insufficient documentation

## 2020-12-09 HISTORY — DX: Fatty (change of) liver, not elsewhere classified: K76.0

## 2020-12-09 HISTORY — PX: INGUINAL HERNIA REPAIR: SHX194

## 2020-12-09 LAB — CBC
HCT: 49.9 % (ref 39.0–52.0)
Hemoglobin: 17.2 g/dL — ABNORMAL HIGH (ref 13.0–17.0)
MCH: 29.2 pg (ref 26.0–34.0)
MCHC: 34.5 g/dL (ref 30.0–36.0)
MCV: 84.6 fL (ref 80.0–100.0)
Platelets: 162 10*3/uL (ref 150–400)
RBC: 5.9 MIL/uL — ABNORMAL HIGH (ref 4.22–5.81)
RDW: 12.5 % (ref 11.5–15.5)
WBC: 3.7 10*3/uL — ABNORMAL LOW (ref 4.0–10.5)
nRBC: 0 % (ref 0.0–0.2)

## 2020-12-09 SURGERY — REPAIR, HERNIA, INGUINAL, ADULT
Anesthesia: General | Site: Inguinal | Laterality: Right

## 2020-12-09 MED ORDER — LACTATED RINGERS IV SOLN: INTRAVENOUS | Status: DC | PRN

## 2020-12-09 MED ORDER — MIDAZOLAM HCL 5 MG/5ML IJ SOLN
INTRAMUSCULAR | Status: DC | PRN
  Administered 2020-12-09: 2 mg via INTRAVENOUS

## 2020-12-09 MED ORDER — CHLORHEXIDINE GLUCONATE 0.12 % MT SOLN
OROMUCOSAL | Status: AC
  Filled 2020-12-09: qty 15

## 2020-12-09 MED ORDER — GABAPENTIN 300 MG PO CAPS
ORAL_CAPSULE | ORAL | Status: AC
  Administered 2020-12-09: 11:00:00 300 mg via ORAL
  Filled 2020-12-09: qty 1

## 2020-12-09 MED ORDER — BUPIVACAINE HCL (PF) 0.25 % IJ SOLN
INTRAMUSCULAR | Status: AC
  Filled 2020-12-09: qty 30

## 2020-12-09 MED ORDER — ACETAMINOPHEN 500 MG PO TABS: 1000.0000 mg | ORAL_TABLET | ORAL | Status: AC

## 2020-12-09 MED ORDER — BUPIVACAINE HCL (PF) 0.25 % IJ SOLN
INTRAMUSCULAR | Status: DC | PRN
Start: 2020-12-09 — End: 2020-12-09
  Administered 2020-12-09: 5 mL

## 2020-12-09 MED ORDER — DEXMEDETOMIDINE (PRECEDEX) IN NS 20 MCG/5ML (4 MCG/ML) IV SYRINGE
PREFILLED_SYRINGE | INTRAVENOUS | Status: AC
  Filled 2020-12-09: qty 10

## 2020-12-09 MED ORDER — OXYCODONE HCL 5 MG PO TABS: 5.0000 mg | ORAL_TABLET | Freq: Four times a day (QID) | ORAL | 0 refills | Status: AC | PRN

## 2020-12-09 MED ORDER — FENTANYL CITRATE (PF) 250 MCG/5ML IJ SOLN
INTRAMUSCULAR | Status: DC | PRN
  Administered 2020-12-09: 150 ug via INTRAVENOUS
  Administered 2020-12-09 (×2): 25 ug via INTRAVENOUS

## 2020-12-09 MED ORDER — PROPOFOL 10 MG/ML IV BOLUS
INTRAVENOUS | Status: DC | PRN
  Administered 2020-12-09: 200 mg via INTRAVENOUS

## 2020-12-09 MED ORDER — STERILE WATER FOR IRRIGATION IR SOLN
Status: DC | PRN
  Administered 2020-12-09: 500 mL

## 2020-12-09 MED ORDER — ORAL CARE MOUTH RINSE: 15.0000 mL | Freq: Once | OROMUCOSAL | Status: AC

## 2020-12-09 MED ORDER — DEXAMETHASONE SODIUM PHOSPHATE 10 MG/ML IJ SOLN
INTRAMUSCULAR | Status: DC | PRN
  Administered 2020-12-09: 10 mg via INTRAVENOUS

## 2020-12-09 MED ORDER — CHLORHEXIDINE GLUCONATE 0.12 % MT SOLN
15.0000 mL | Freq: Once | OROMUCOSAL | Status: AC
  Administered 2020-12-09: 11:00:00 15 mL via OROMUCOSAL

## 2020-12-09 MED ORDER — CEFAZOLIN SODIUM-DEXTROSE 2-4 GM/100ML-% IV SOLN
2.0000 g | INTRAVENOUS | Status: AC
  Administered 2020-12-09: 13:00:00 2 g via INTRAVENOUS

## 2020-12-09 MED ORDER — MIDAZOLAM HCL 2 MG/2ML IJ SOLN
INTRAMUSCULAR | Status: AC
  Filled 2020-12-09: qty 2

## 2020-12-09 MED ORDER — DEXMEDETOMIDINE HCL 200 MCG/2ML IV SOLN
INTRAVENOUS | Status: DC | PRN
  Administered 2020-12-09 (×3): 4 ug via INTRAVENOUS
  Administered 2020-12-09 (×3): 8 ug via INTRAVENOUS
  Administered 2020-12-09: 4 ug via INTRAVENOUS

## 2020-12-09 MED ORDER — CHLORHEXIDINE GLUCONATE CLOTH 2 % EX PADS: 6.0000 | MEDICATED_PAD | Freq: Once | CUTANEOUS | Status: DC

## 2020-12-09 MED ORDER — SUGAMMADEX SODIUM 200 MG/2ML IV SOLN
INTRAVENOUS | Status: DC | PRN
  Administered 2020-12-09: 200 mg via INTRAVENOUS

## 2020-12-09 MED ORDER — LIDOCAINE 2% (20 MG/ML) 5 ML SYRINGE
INTRAMUSCULAR | Status: AC
  Filled 2020-12-09: qty 5

## 2020-12-09 MED ORDER — HYDROMORPHONE HCL 1 MG/ML IJ SOLN: 0.2500 mg | INTRAMUSCULAR | Status: DC | PRN

## 2020-12-09 MED ORDER — GABAPENTIN 300 MG PO CAPS: 300.0000 mg | ORAL_CAPSULE | ORAL | Status: AC

## 2020-12-09 MED ORDER — DEXAMETHASONE SODIUM PHOSPHATE 10 MG/ML IJ SOLN
INTRAMUSCULAR | Status: AC
  Filled 2020-12-09: qty 1

## 2020-12-09 MED ORDER — FENTANYL CITRATE (PF) 100 MCG/2ML IJ SOLN
INTRAMUSCULAR | Status: AC
  Filled 2020-12-09: qty 2

## 2020-12-09 MED ORDER — PHENYLEPHRINE 40 MCG/ML (10ML) SYRINGE FOR IV PUSH (FOR BLOOD PRESSURE SUPPORT)
PREFILLED_SYRINGE | INTRAVENOUS | Status: DC | PRN
  Administered 2020-12-09: 40 ug via INTRAVENOUS

## 2020-12-09 MED ORDER — CEFAZOLIN SODIUM-DEXTROSE 2-4 GM/100ML-% IV SOLN
INTRAVENOUS | Status: AC
  Filled 2020-12-09: qty 100

## 2020-12-09 MED ORDER — LACTATED RINGERS IV SOLN: INTRAVENOUS | Status: DC

## 2020-12-09 MED ORDER — 0.9 % SODIUM CHLORIDE (POUR BTL) OPTIME
TOPICAL | Status: DC | PRN
  Administered 2020-12-09: 14:00:00 1000 mL

## 2020-12-09 MED ORDER — ROCURONIUM BROMIDE 10 MG/ML (PF) SYRINGE
PREFILLED_SYRINGE | INTRAVENOUS | Status: DC | PRN
  Administered 2020-12-09: 20 mg via INTRAVENOUS
  Administered 2020-12-09: 10 mg via INTRAVENOUS
  Administered 2020-12-09: 60 mg via INTRAVENOUS
  Administered 2020-12-09: 10 mg via INTRAVENOUS

## 2020-12-09 MED ORDER — PROPOFOL 10 MG/ML IV BOLUS
INTRAVENOUS | Status: AC
  Filled 2020-12-09: qty 20

## 2020-12-09 MED ORDER — FENTANYL CITRATE (PF) 250 MCG/5ML IJ SOLN
INTRAMUSCULAR | Status: AC
  Filled 2020-12-09: qty 5

## 2020-12-09 MED ORDER — ONDANSETRON HCL 4 MG/2ML IJ SOLN
INTRAMUSCULAR | Status: AC
  Filled 2020-12-09: qty 2

## 2020-12-09 MED ORDER — ONDANSETRON HCL 4 MG/2ML IJ SOLN
INTRAMUSCULAR | Status: DC | PRN
  Administered 2020-12-09: 4 mg via INTRAVENOUS

## 2020-12-09 MED ORDER — LIDOCAINE 2% (20 MG/ML) 5 ML SYRINGE
INTRAMUSCULAR | Status: DC | PRN
  Administered 2020-12-09: 100 mg via INTRAVENOUS

## 2020-12-09 MED ORDER — ROCURONIUM BROMIDE 10 MG/ML (PF) SYRINGE
PREFILLED_SYRINGE | INTRAVENOUS | Status: AC
  Filled 2020-12-09: qty 10

## 2020-12-09 MED ORDER — ACETAMINOPHEN 500 MG PO TABS
ORAL_TABLET | ORAL | Status: AC
  Administered 2020-12-09: 11:00:00 1000 mg via ORAL
  Filled 2020-12-09: qty 2

## 2020-12-09 SURGICAL SUPPLY — 45 items
APL PRP STRL LF DISP 70% ISPRP (MISCELLANEOUS) ×1
APL SKNCLS STERI-STRIP NONHPOA (GAUZE/BANDAGES/DRESSINGS) ×1
BENZOIN TINCTURE PRP APPL 2/3 (GAUZE/BANDAGES/DRESSINGS) ×3 IMPLANT
BLADE CLIPPER SURG (BLADE) ×2 IMPLANT
CHLORAPREP W/TINT 26 (MISCELLANEOUS) ×3 IMPLANT
CLOSURE STERI-STRIP 1/2X4 (GAUZE/BANDAGES/DRESSINGS) ×1
CLSR STERI-STRIP ANTIMIC 1/2X4 (GAUZE/BANDAGES/DRESSINGS) ×1 IMPLANT
COVER SURGICAL LIGHT HANDLE (MISCELLANEOUS) ×3 IMPLANT
DRAIN PENROSE 1/2X12 LTX STRL (WOUND CARE) ×2 IMPLANT
DRAPE LAPAROTOMY TRNSV 102X78 (DRAPES) ×2 IMPLANT
DRSG TEGADERM 4X4.75 (GAUZE/BANDAGES/DRESSINGS) ×3 IMPLANT
ELECT CAUTERY BLADE 6.4 (BLADE) ×2 IMPLANT
ELECT REM PT RETURN 9FT ADLT (ELECTROSURGICAL) ×3
ELECTRODE REM PT RTRN 9FT ADLT (ELECTROSURGICAL) ×1 IMPLANT
GAUZE SPONGE 4X4 12PLY STRL (GAUZE/BANDAGES/DRESSINGS) ×3 IMPLANT
GLOVE BIO SURGEON STRL SZ7 (GLOVE) ×3 IMPLANT
GLOVE BIOGEL PI IND STRL 7.5 (GLOVE) ×1 IMPLANT
GLOVE BIOGEL PI INDICATOR 7.5 (GLOVE) ×2
GOWN STRL REUS W/ TWL LRG LVL3 (GOWN DISPOSABLE) ×2 IMPLANT
GOWN STRL REUS W/TWL LRG LVL3 (GOWN DISPOSABLE) ×6
KIT BASIN OR (CUSTOM PROCEDURE TRAY) ×3 IMPLANT
KIT TURNOVER KIT B (KITS) ×3 IMPLANT
MESH PARIETEX PROGRIP RIGHT (Mesh General) ×2 IMPLANT
NDL HYPO 25GX1X1/2 BEV (NEEDLE) ×1 IMPLANT
NEEDLE HYPO 25GX1X1/2 BEV (NEEDLE) ×3 IMPLANT
NS IRRIG 1000ML POUR BTL (IV SOLUTION) ×3 IMPLANT
PACK GENERAL/GYN (CUSTOM PROCEDURE TRAY) ×3 IMPLANT
PAD ARMBOARD 7.5X6 YLW CONV (MISCELLANEOUS) ×3 IMPLANT
PENCIL SMOKE EVACUATOR (MISCELLANEOUS) ×3 IMPLANT
SPONGE INTESTINAL PEANUT (DISPOSABLE) ×5 IMPLANT
SUT MNCRL AB 4-0 PS2 18 (SUTURE) ×3 IMPLANT
SUT PDS AB 0 CT 36 (SUTURE) IMPLANT
SUT PDS AB 2-0 CT1 27 (SUTURE) ×4 IMPLANT
SUT PROLENE 2 0 SH DA (SUTURE) ×4 IMPLANT
SUT SILK 2 0 SH (SUTURE) ×2 IMPLANT
SUT SILK 3 0 (SUTURE)
SUT SILK 3-0 18XBRD TIE 12 (SUTURE) IMPLANT
SUT VIC AB 0 CT2 27 (SUTURE) ×1 IMPLANT
SUT VIC AB 2-0 SH 27 (SUTURE) ×3
SUT VIC AB 2-0 SH 27X BRD (SUTURE) ×1 IMPLANT
SUT VIC AB 3-0 SH 27 (SUTURE) ×3
SUT VIC AB 3-0 SH 27XBRD (SUTURE) ×1 IMPLANT
SYR CONTROL 10ML LL (SYRINGE) ×3 IMPLANT
TOWEL GREEN STERILE (TOWEL DISPOSABLE) ×3 IMPLANT
TOWEL GREEN STERILE FF (TOWEL DISPOSABLE) ×3 IMPLANT

## 2020-12-09 NOTE — Discharge Instructions (Signed)
[facility] _______[facility] ° °UMBILICAL OR INGUINAL HERNIA REPAIR: POST OP INSTRUCTIONS ° °Always review your discharge instruction sheet given to you by the facility where your surgery was performed. °IF YOU HAVE DISABILITY OR FAMILY LEAVE FORMS, YOU MUST BRING THEM TO THE OFFICE FOR PROCESSING.   °DO NOT GIVE THEM TO YOUR DOCTOR. ° °1. A  prescription for pain medication may be given to you upon discharge.  Take your pain medication as prescribed, if needed.  If narcotic pain medicine is not needed, then you may take acetaminophen (Tylenol) or ibuprofen (Advil) as needed. °2. Take your usually prescribed medications unless otherwise directed. °If you need a refill on your pain medication, please contact your pharmacy.  They will contact our office to request authorization. Prescriptions will not be filled after 5 pm or on week-ends. °3. You should follow a light diet the first 24 hours after arrival home, such as soup and crackers, etc.  Be sure to include lots of fluids daily.  Resume your normal diet the day after surgery. °4.Most patients will experience some swelling and bruising around the umbilicus or in the groin and scrotum.  Ice packs and reclining will help.  Swelling and bruising can take several days to resolve.  °6. It is common to experience some constipation if taking pain medication after surgery.  Increasing fluid intake and taking a stool softener (such as Colace) will usually help or prevent this problem from occurring.  A mild laxative (Milk of Magnesia or Miralax) should be taken according to package directions if there are no bowel movements after 48 hours. °7. Unless discharge instructions indicate otherwise, you may remove your bandages 24-48 hours after surgery, and you may shower at that time.  You may have steri-strips (small skin tapes) in place directly over the incision.  These strips should be left on the skin for 7-10 days.  If your surgeon used skin glue on the  incision, you may shower in 24 hours.  The glue will flake off over the next 2-3 weeks.  Any sutures or staples will be removed at the office during your follow-up visit. °8. ACTIVITIES:  You may resume regular (light) daily activities beginning the next day--such as daily self-care, walking, climbing stairs--gradually increasing activities as tolerated.  You may have sexual intercourse when it is comfortable.  Refrain from any heavy lifting or straining until approved by your doctor. ° °a.You may drive when you are no longer taking prescription pain medication, you can comfortably wear a seatbelt, and you can safely maneuver your car and apply brakes. °b.RETURN TO WORK:   °_____________________________________________ ° °9.You should see your doctor in the office for a follow-up appointment approximately 2-3 weeks after your surgery.  Make sure that you call for this appointment within a day or two after you arrive home to insure a convenient appointment time. °10.OTHER INSTRUCTIONS: _________________________ °   _____________________________________ ° °WHEN TO CALL YOUR DOCTOR: °1. Fever over 101.0 °2. Inability to urinate °3. Nausea and/or vomiting °4. Extreme swelling or bruising °5. Continued bleeding from incision. °6. Increased pain, redness, or drainage from the incision ° °The clinic staff is available to answer your questions during regular business hours.  Please don’t hesitate to call and ask to speak to one of the nurses for clinical concerns.  If you have a medical emergency, go to the nearest emergency room or call 911.  A surgeon from [facility] is always on call at the hospital ° ° °  [address], [address], [place], [place]  [address] ? ° [address] [address], [place], [place]   [address] °[phone] ? [phone] ? FAX [phone] °Web site: [email or web address] °

## 2020-12-09 NOTE — Op Note (Addendum)
Hernia, Recurrent, Open, Procedure Note  Indications: 44 year old male who presents with an inguinal hernia. This is a 44 year old male with special needs who resides in a group home.  He is accompanied by his father.  He is s/p right inguinal hernia repair with mesh in 2015.  He had been doing well until recently when he presented to the ED with nausea, vomiting, and right groin pain.  He was noted to have a recurrent right inguinal hernia with some incarcerated bowel.  The hernia was reduced and his SBO resolved.     Pre-operative Diagnosis: right reducible inguinal hernia  Post-operative Diagnosis: same  Surgeon: Manus Rudd, MD  Assistants: Lannette Donath, MD  I was present for the critical and key portions of the surgery and I was immediately available throughout the entire procedure.  I have reviewed and agree with the operative note as documented by the resident.   Anesthesia: General endotracheal anesthesia  ASA Class: 2  Procedure Details  The patient was seen again in the Holding Room. The risks, benefits, complications, treatment options, and expected outcomes were discussed with the patient. The possibilities of reaction to medication, pulmonary aspiration, perforation of viscus, bleeding, recurrent infection, the need for additional procedures, and development of a complication requiring transfusion or further operation were discussed with the patient and/or family. The likelihood of success in repairing the hernia and returning the patient to their previous functional status is good.  There was concurrence with the proposed plan, and informed consent was obtained. The site of surgery was properly noted/marked. The patient was taken to the Operating Room, identified as [facility] [place], and the procedure verified as right recurrent inguinal hernia repair. A Time Out was held and the above information confirmed.  The patient was placed in the supine position and underwent induction of  anesthesia. The lower abdomen and groin was prepped with Chloraprep and draped in the standard fashion, and 0.25% Marcaine with epinephrine was used to anesthetize the skin over the mid-portion of the inguinal canal. An oblique incision was made over his prior incision. Dissection was carried down through the subcutaneous tissue with cautery to the external oblique fascia.  We opened the external oblique fascia along the direction of its fibers to the external ring. The prior mesh was densely adherent to the posterior aspect of the external oblique fascia. These adhesions were taken down with a combination of blunt and sharp dissection. The posterior aspect of the mesh was adherent to the inferior portion of the cord which was taken down with a combination of blunt dissection and electrocautery. The mid portion of the mesh was removed. The spermatic cord was circumferentially dissected bluntly and retracted with a Penrose drain. The floor of the inguinal canal was inspected and a medial direct hernia was observed. The hernia sac was dissected free of the cord structures with a combination of blunt dissection and electrocautery. The floor of the inguinal canal was closed with interrupted 2-0 PDS sutures.  We skeletonized the spermatic cord and also observed a recurrent indirect defect which was separated from the cord structures and high ligated with a 2-0 silk suture.  We used a right Progrip mesh which was inserted and deployed across the floor of the inguinal canal. The mesh was tucked underneath the external oblique fascia laterally.  The flap of the mesh was closed around the spermatic cord to recreate the internal inguinal ring.  The mesh was secured to the pubic tubercle with a  Prolene suture. Additional tacking sutures were placed in the conjoint tendon, the inguinal ligament, and around the cord to re-create the internal ring. The wound was irrigated with saline solution. The external oblique fascia was  reapproximated with 2-0 Vicryl.  3-0 Vicryl was used to close the subcutaneous tissues and 4-0 Monocryl was used to close the skin in subcuticular fashion.  Benzoin and steri-strips were used to seal the incision.  A clean dressing was applied.  The patient was then extubated and brought to the recovery room in stable condition.  All sponge, instrument, and needle counts were correct prior to closure and at the conclusion of the case.   Estimated Blood Loss: Minimal                 Complications: None; patient tolerated the procedure well.         Disposition: PACU - hemodynamically stable.         Condition: stable  [name]. [name], MD, [facility]  General/ Trauma Surgery   12/09/2020 6:44 PM

## 2020-12-09 NOTE — H&P (Signed)
History of Present Illness  The patient is a 44 year old male who presents with an inguinal hernia. This is a 44 year old male with special needs who resides in a group home. He is accompanied by his father. He is s/p right inguinal hernia repair with mesh in 2015. He had been doing well until recently when he presented to the ED with nausea, vomiting, and right groin pain. He was noted to have a recurrent right inguinal hernia with some incarcerated bowel. The hernia was reduced and his SBO resolved. He presents now to discuss repair.  CLINICAL DATA: Nausea and vomiting  EXAM: CT ABDOMEN AND PELVIS WITHOUT CONTRAST  TECHNIQUE: Multidetector CT imaging of the abdomen and pelvis was performed following the standard protocol without IV contrast.  COMPARISON: Ultrasound from earlier in the same day.  FINDINGS: Lower chest: No acute abnormality.  Hepatobiliary: Liver is within normal limits. Gallbladder is contracted. Known gallstones are not well appreciated on this exam.  Pancreas: Unremarkable. No pancreatic ductal dilatation or surrounding inflammatory changes.  Spleen: Normal in size without focal abnormality.  Adrenals/Urinary Tract: Adrenal glands are within normal limits. Kidneys demonstrate no renal calculi or urinary tract obstructive changes.  Stomach/Bowel: Scattered diverticular change of the colon is noted. No evidence of diverticulitis is seen. The appendix is within normal limits. Terminal ileum is unremarkable. In the mid to distal ileum there is evidence of a right inguinal hernia containing a loop of distal small bowel causing proximal small bowel obstruction. Fluid is noted within the distal esophagus consistent with reflux. The stomach is otherwise within normal limits.  Vascular/Lymphatic: No significant vascular findings are present. No enlarged abdominal or pelvic lymph nodes.  Reproductive: Prostate is unremarkable.  Other: No  abdominal ascites is seen. Right inguinal hernia is noted as described above containing a loop of incarcerated small bowel causing proximal small-bowel obstruction involving the jejunum and proximal ileum.  Musculoskeletal: No acute or significant osseous findings.  IMPRESSION: Small-bowel obstruction in the mid to distal ileum secondary to an incarcerated right inguinal hernia.  Previously seen cholelithiasis is not well appreciated.  No other focal abnormality is noted.   Electronically Signed By: Alcide Clever M.D. On: 09/30/2020 20:25   Past Surgical History  Oral Surgery  Diagnostic Studies History  Colonoscopy never  Allergies  No Known Drug Allergies  Allergies Reconciled  Medication History  Multiple Vitamin (Oral) Active. Medications Reconciled  Social History  Caffeine use Coffee. No alcohol use No drug use Tobacco use Never smoker.  Family History  Breast Cancer Mother. Prostate Cancer Father.  Other Problems Inguinal Hernia     Review of Systems  General Not Present- Appetite Loss, Chills, Fatigue, Fever, Night Sweats, Weight Gain and Weight Loss. Skin Not Present- Change in Wart/Mole, Dryness, Hives, Jaundice, New Lesions, Non-Healing Wounds, Rash and Ulcer. HEENT Not Present- Earache, Hearing Loss, Hoarseness, Nose Bleed, Oral Ulcers, Ringing in the Ears, Seasonal Allergies, Sinus Pain, Sore Throat, Visual Disturbances, Wears glasses/contact lenses and Yellow Eyes. Respiratory Not Present- Bloody sputum, Chronic Cough, Difficulty Breathing, Snoring and Wheezing. Breast Not Present- Breast Mass, Breast Pain, Nipple Discharge and Skin Changes. Cardiovascular Not Present- Chest Pain, Difficulty Breathing Lying Down, Leg Cramps, Palpitations, Rapid Heart Rate, Shortness of Breath and Swelling of Extremities. Male Genitourinary Not Present- Blood in Urine, Change in Urinary Stream, Frequency, Impotence, Nocturia,  Painful Urination, Urgency and Urine Leakage. Musculoskeletal Not Present- Back Pain, Joint Pain, Joint Stiffness, Muscle Pain, Muscle Weakness and Swelling of Extremities. Neurological Not  Present- Decreased Memory, Fainting, Headaches, Numbness, Seizures, Tingling, Tremor, Trouble walking and Weakness. Psychiatric Not Present- Anxiety, Bipolar, Change in Sleep Pattern, Depression, Fearful and Frequent crying. Endocrine Not Present- Cold Intolerance, Excessive Hunger, Hair Changes, Heat Intolerance, Hot flashes and New Diabetes. Hematology Not Present- Blood Thinners, Easy Bruising, Excessive bleeding, Gland problems, HIV and Persistent Infections.  Vitals  Weight: 188 lb Height: 73in Body Surface Area: 2.1 m Body Mass Index: 24.8 kg/m  Temp.: 98.57F(Tympanic)  Pulse: 116 (Regular)  BP: 124/72(Sitting, Left Arm, Standard)        Physical Exam   The physical exam findings are as follows: Note:Constitutional: WDWN in NAD, conversant, no obvious deformities; resting comfortably Eyes: Pupils equal, round; sclera anicteric; moist conjunctiva; no lid lag HENT: Oral mucosa moist; good dentition Neck: No masses palpated, trachea midline; no thyromegaly Lungs: CTA bilaterally; normal respiratory effort CV: Regular rate and rhythm; no murmurs; extremities well-perfused with no edema Abd: +bowel sounds, soft, non-tender, no palpable organomegaly; GU: Right inguinal hernia - spontaneously reducible; no testicular swelling; no LIH Musc: Normal gait; no apparent clubbing or cyanosis in extremities Lymphatic: No palpable cervical or axillary lymphadenopathy Skin: Warm, dry; no sign of jaundice Psychiatric - alert and oriented x 4; calm mood and affect    Assessment & Plan   RECURRENT RIGHT INGUINAL HERNIA (K40.91)  Current Plans Schedule for Surgery - Open repair of recurrent right inguinal hernia with mesh. The surgical procedure has been discussed with the  patient. Potential risks, benefits, alternative treatments, and expected outcomes have been explained. All of the patient's questions at this time have been answered. The likelihood of reaching the patient's treatment goal is good. The patient understand the proposed surgical procedure and wishes to proceed.  [name]. [name], MD, [facility]  General/ Trauma Surgery   12/09/2020 11:31 AM \

## 2020-12-09 NOTE — Transfer of Care (Signed)
Immediate Anesthesia Transfer of Care Note  Patient: Vincent Singh  Procedure(s) Performed: OPEN REPAIR OF RECURRENT RIGHT INGUINAL HERNIA WITH MESH (Right Inguinal)  Patient Location: PACU  Anesthesia Type:General  Level of Consciousness: drowsy and responds to stimulation  Airway & Oxygen Therapy: Patient Spontanous Breathing and Patient connected to face mask oxygen  Post-op Assessment: Report given to RN and Post -op Vital signs reviewed and stable  Post vital signs: Reviewed and stable  Last Vitals:  Vitals Value Taken Time  BP 146/91 12/09/20 1506  Temp    Pulse 117 12/09/20 1508  Resp 13 12/09/20 1508  SpO2 99 % 12/09/20 1508  Vitals shown include unvalidated device data.  Last Pain:  Vitals:   12/09/20 1113  TempSrc:   PainSc: 0-No pain      Patients Stated Pain Goal: 3 (12/09/20 1113)  Complications: No complications documented.

## 2020-12-09 NOTE — Anesthesia Procedure Notes (Signed)
Anesthesia Regional Block: TAP block   Pre-Anesthetic Checklist: ,, timeout performed, Correct Patient, Correct Site, Correct Laterality, Correct Procedure, Correct Position, site marked, Risks and benefits discussed, pre-op evaluation,  At surgeon's request and post-op pain management  Laterality: Right  Prep: Maximum Sterile Barrier Precautions used, chloraprep       Needles:  Injection technique: Single-shot  Needle Type: Echogenic Stimulator Needle     Needle Length: 9cm  Needle Gauge: 21     Additional Needles:   Narrative:  Start time: 12/09/2020 1:00 PM End time: 12/09/2020 1:10 PM Injection made incrementally with aspirations every 5 mL.  Performed by: Personally  Anesthesiologist: Kipp Brood, MD  Additional Notes: 20 cc 0.5% Bupivacaine 1:200 Epi 10 cc 1.3% Exparel injected easily

## 2020-12-09 NOTE — Anesthesia Procedure Notes (Signed)
Procedure Name: Intubation Date/Time: 12/09/2020 12:58 PM Performed by: Adria Dill, CRNA Pre-anesthesia Checklist: Patient identified, Emergency Drugs available, Suction available and Patient being monitored Patient Re-evaluated:Patient Re-evaluated prior to induction Oxygen Delivery Method: Circle system utilized Preoxygenation: Pre-oxygenation with 100% oxygen Induction Type: IV induction Ventilation: Mask ventilation without difficulty Laryngoscope Size: Miller and 3 Grade View: Grade I Tube type: Oral Tube size: 7.5 mm Number of attempts: 1 Airway Equipment and Method: Stylet and Oral airway Placement Confirmation: ETT inserted through vocal cords under direct vision,  positive ETCO2 and breath sounds checked- equal and bilateral Secured at: 22 cm Tube secured with: Tape Dental Injury: Teeth and Oropharynx as per pre-operative assessment

## 2020-12-10 ENCOUNTER — Encounter (HOSPITAL_COMMUNITY): Payer: Self-pay | Admitting: Surgery

## 2020-12-11 NOTE — Anesthesia Postprocedure Evaluation (Signed)
Anesthesia Post Note  Patient: Vincent Singh  Procedure(s) Performed: OPEN REPAIR OF RECURRENT RIGHT INGUINAL HERNIA WITH MESH (Right Inguinal)     Patient location during evaluation: PACU Anesthesia Type: General Level of consciousness: awake and alert Pain management: pain level controlled Vital Signs Assessment: post-procedure vital signs reviewed and stable Respiratory status: spontaneous breathing, nonlabored ventilation, respiratory function stable and patient connected to nasal cannula oxygen Cardiovascular status: blood pressure returned to baseline and stable Postop Assessment: no apparent nausea or vomiting Anesthetic complications: no   No complications documented.  Last Vitals:  Vitals:   12/09/20 1522 12/09/20 1529  BP: (!) 133/91 (!) 136/91  Pulse: (!) 107 (!) 112  Resp: 13 13  Temp:  36.5 C  SpO2: 94% 98%    Last Pain:  Vitals:   12/09/20 1529  TempSrc:   PainSc: 0-No pain                 Mirelle Biskup COKER

## 2021-04-04 IMAGING — CT CT ABD-PELV W/O CM
2 of 4 series · 16 of 46 positions shown, 18 images · non-contrast
Comparison: Ultrasound from earlier in the same day.

CLINICAL DATA: Nausea and vomiting

EXAM:
CT ABDOMEN AND PELVIS WITHOUT CONTRAST
TECHNIQUE: Multidetector CT imaging of the abdomen and pelvis was performed
following the standard protocol without IV contrast.

[Series 3: a/p w/o 5mm · axial · non-contrast · 0.80mm/px · z∈[+782,+1307]mm · 13 of 115 slices shown, 15 images]
[im 5/115  soft-tissue]
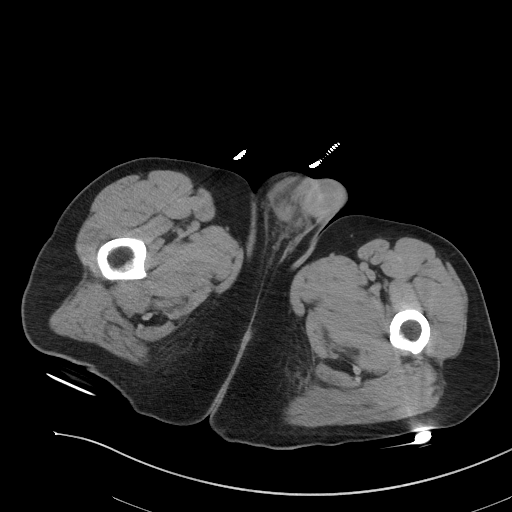
[im 5/115  bone]
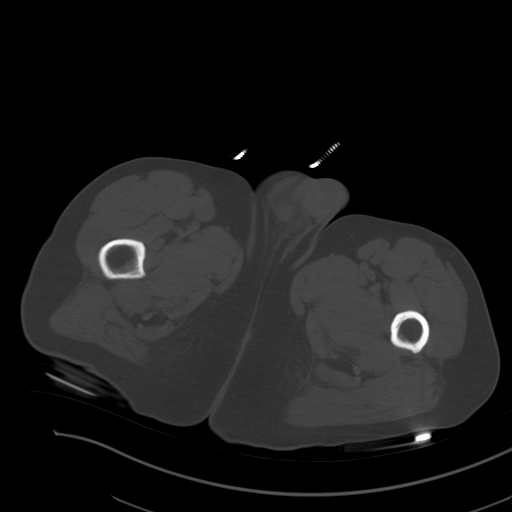
[im 14/115  soft-tissue]
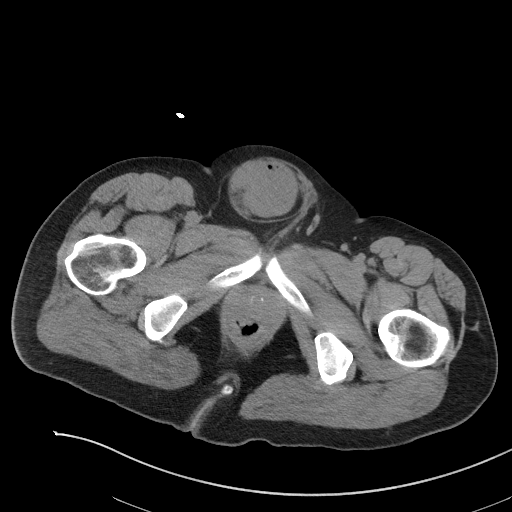
[im 23/115  soft-tissue]
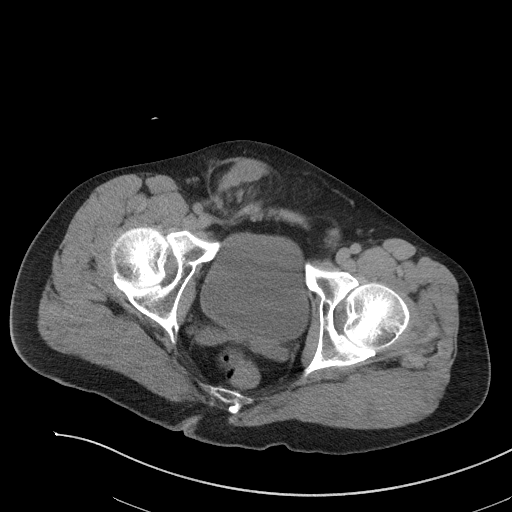
[im 32/115  soft-tissue]
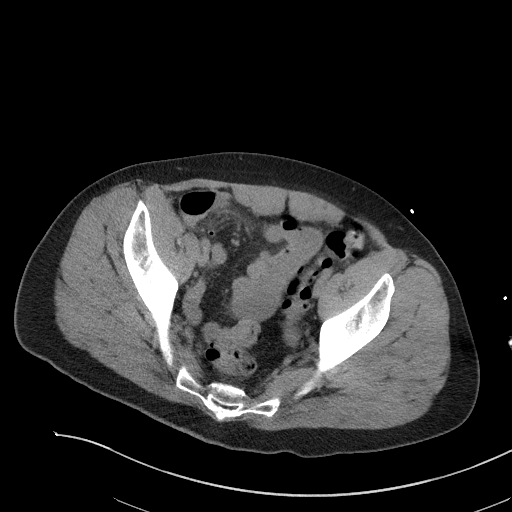
[im 42/115  soft-tissue]
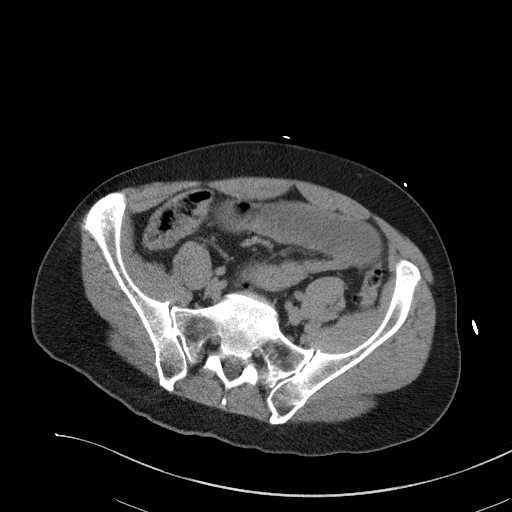
[im 51/115  soft-tissue]
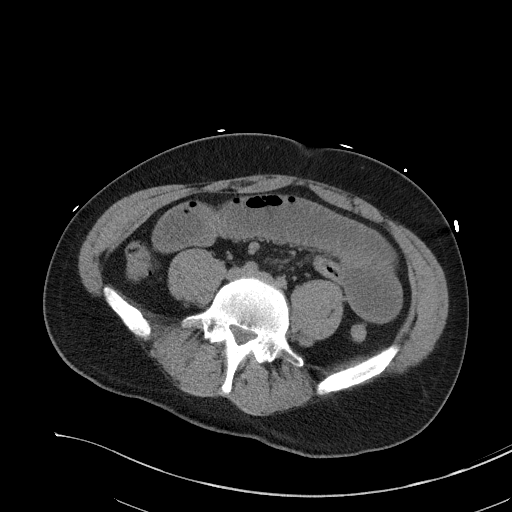
[im 60/115  soft-tissue]
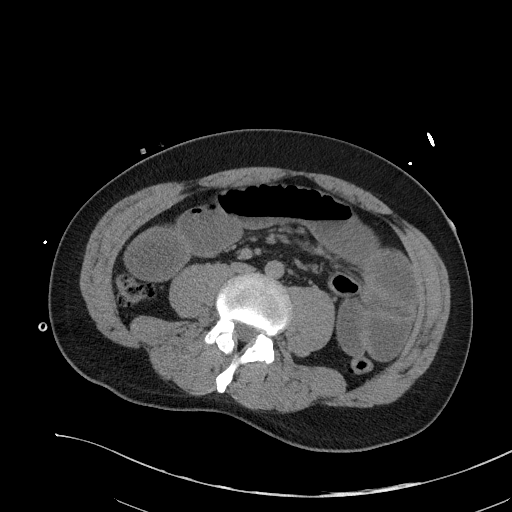
[im 64/115  soft-tissue]
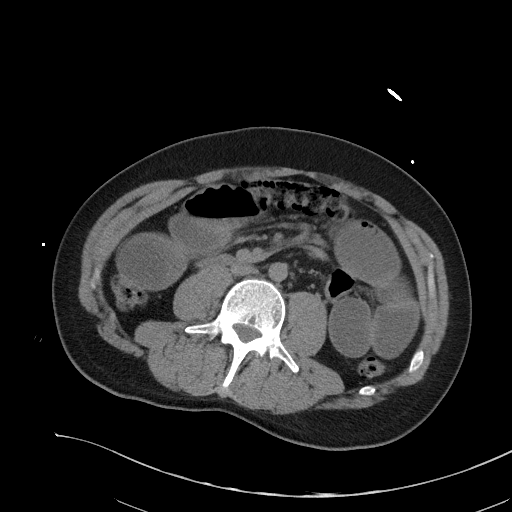
[im 73/115  soft-tissue]
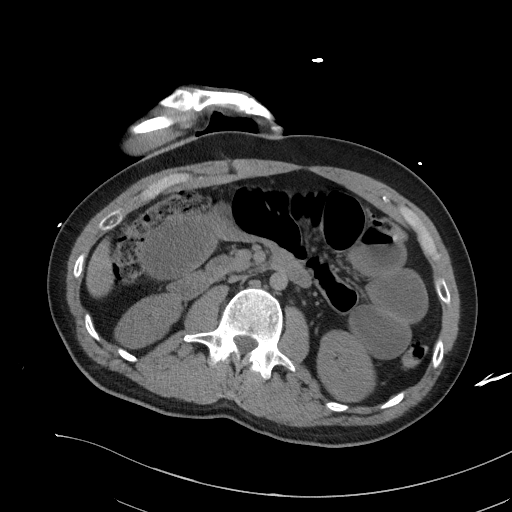
[im 73/115  bone]
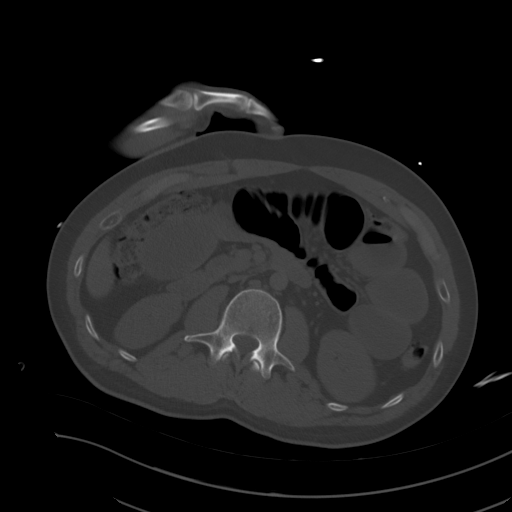
[im 83/115  soft-tissue]
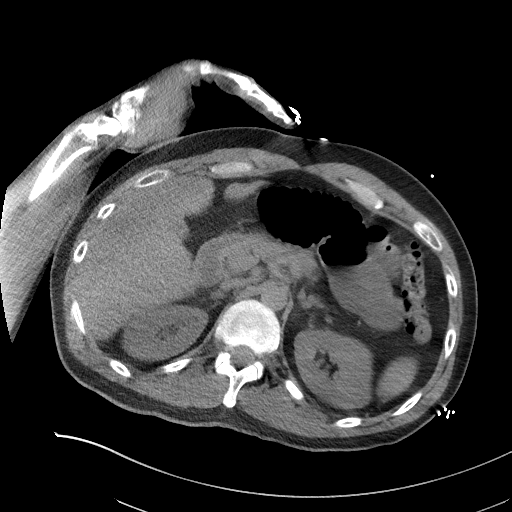
[im 92/115  soft-tissue]
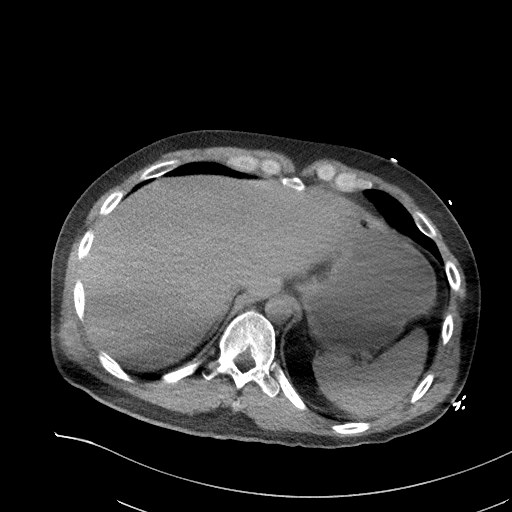
[im 101/115  soft-tissue]
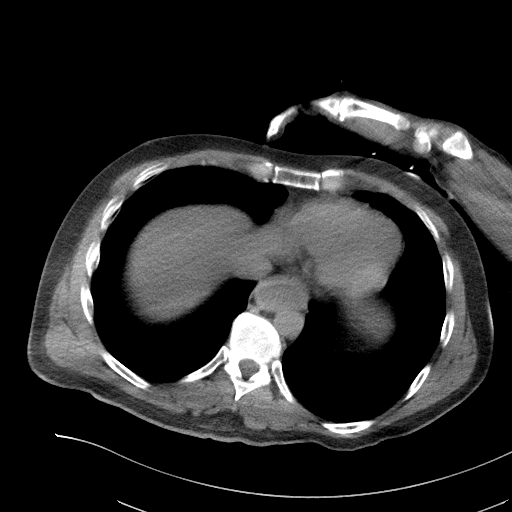
[im 110/115  soft-tissue]
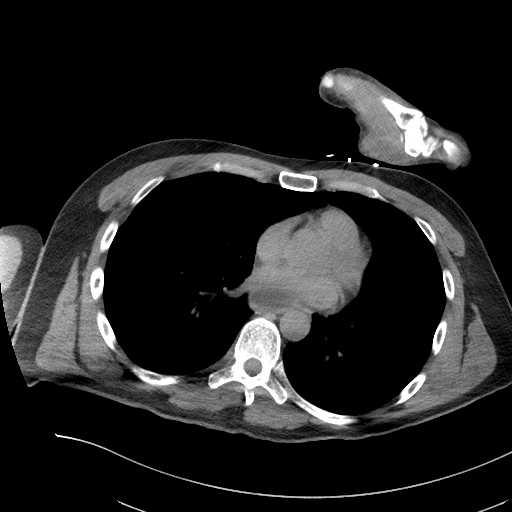

[Series 6: a/p w/o cor · coronal · non-contrast · 0.83mm/px · 3 of 147 slices shown]
[im 49/147  soft-tissue]
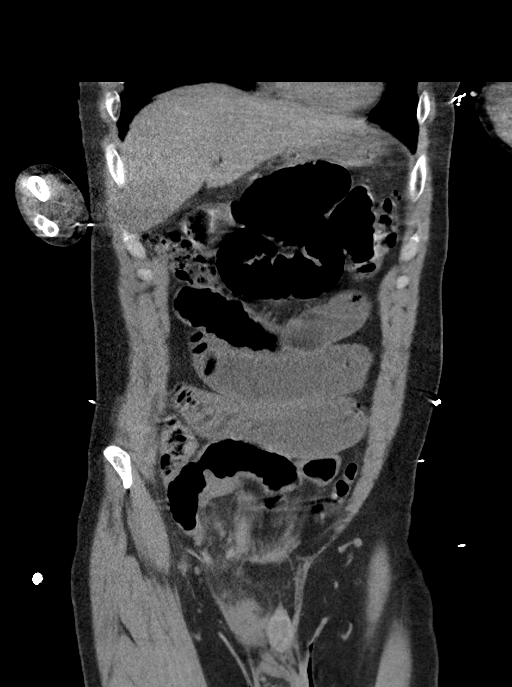
[im 65/147  soft-tissue]
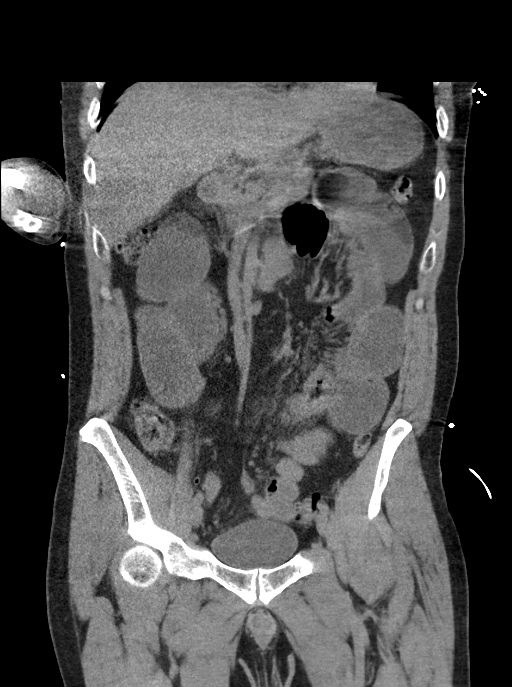
[im 82/147  soft-tissue]
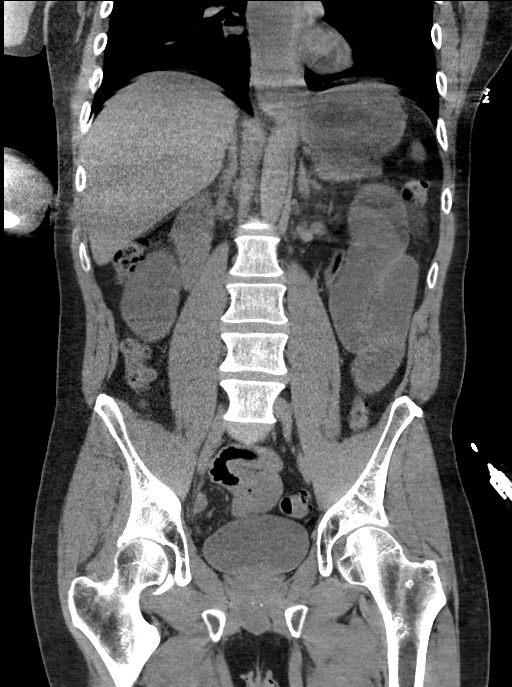

[16 of 46 positions shown; findings below may reference images not displayed]

FINDINGS: Lower chest: No acute abnormality.

Hepatobiliary: Liver is within normal limits. Gallbladder is
contracted. Known gallstones are not well appreciated on this exam.

Pancreas: Unremarkable. No pancreatic ductal dilatation or
surrounding inflammatory changes.

Spleen: Normal in size without focal abnormality.

Adrenals/Urinary Tract: Adrenal glands are within normal limits.
Kidneys demonstrate no renal calculi or urinary tract obstructive
changes.

Stomach/Bowel: Scattered diverticular change of the colon is noted.
No evidence of diverticulitis is seen. The appendix is within normal
limits. Terminal ileum is unremarkable. In the mid to distal ileum
there is evidence of a right inguinal hernia containing a loop of
distal small bowel causing proximal small bowel obstruction. Fluid
is noted within the distal esophagus consistent with reflux. The
stomach is otherwise within normal limits.

Vascular/Lymphatic: No significant vascular findings are present. No
enlarged abdominal or pelvic lymph nodes.

Reproductive: Prostate is unremarkable.

Other: No abdominal ascites is seen. Right inguinal hernia is noted
as described above containing a loop of incarcerated small bowel
causing proximal small-bowel obstruction involving the jejunum and
proximal ileum.

Musculoskeletal: No acute or significant osseous findings.
IMPRESSION: Small-bowel obstruction in the mid to distal ileum secondary to an
incarcerated right inguinal hernia.

Previously seen cholelithiasis is not well appreciated.

No other focal abnormality is noted.

## 2021-04-04 IMAGING — US US ABDOMEN LIMITED
1 series · 14 of 25 positions shown · non-contrast
Comparison: None.

CLINICAL DATA: Right upper quadrant pain with elevated LFTs

EXAM:
ULTRASOUND ABDOMEN LIMITED RIGHT UPPER QUADRANT

[Series 1: us abdomen limited · 14 of 28 slices shown]
[im 1/28]
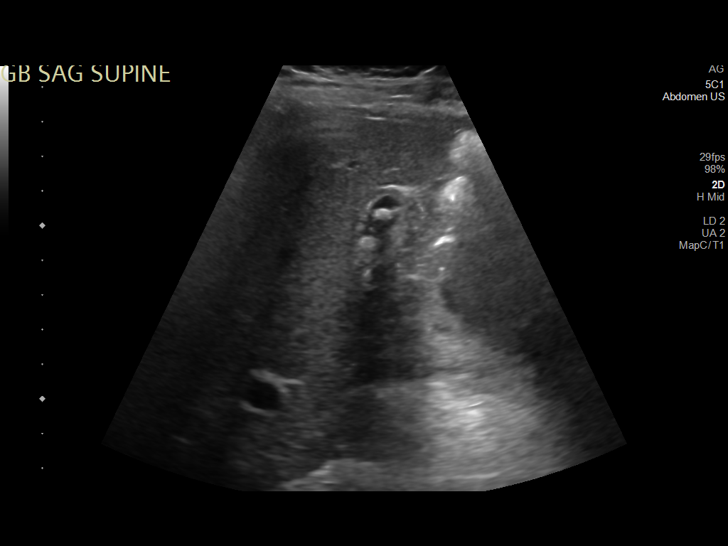
[im 3/28]
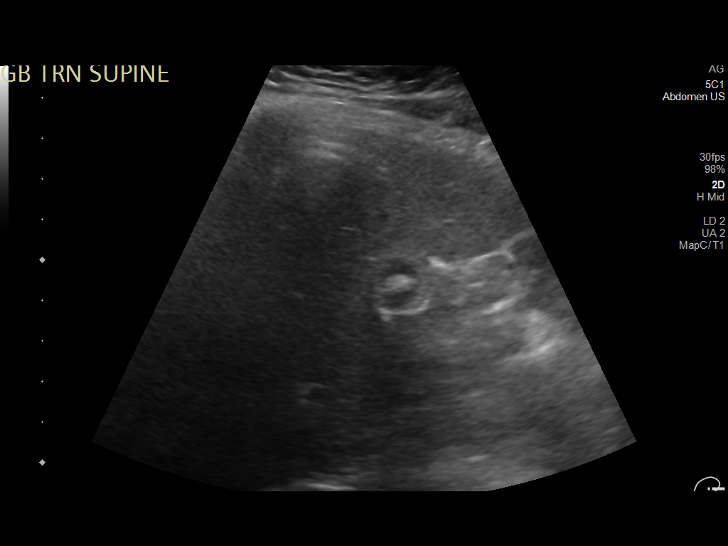
[im 5/28]
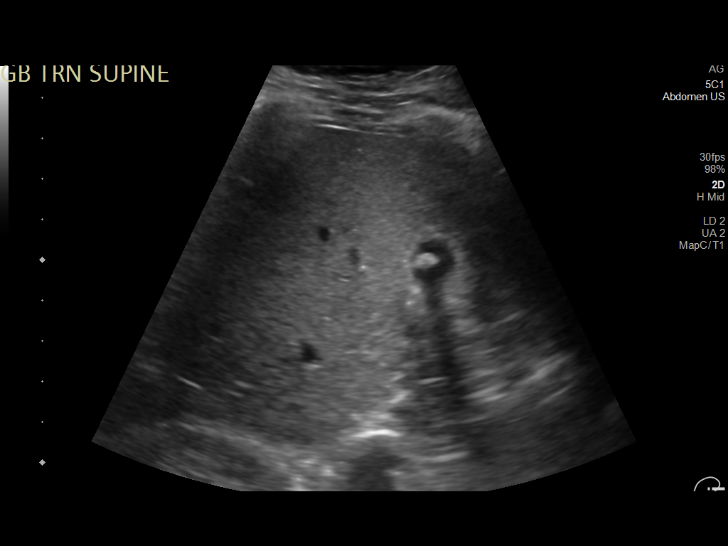
[im 7/28]
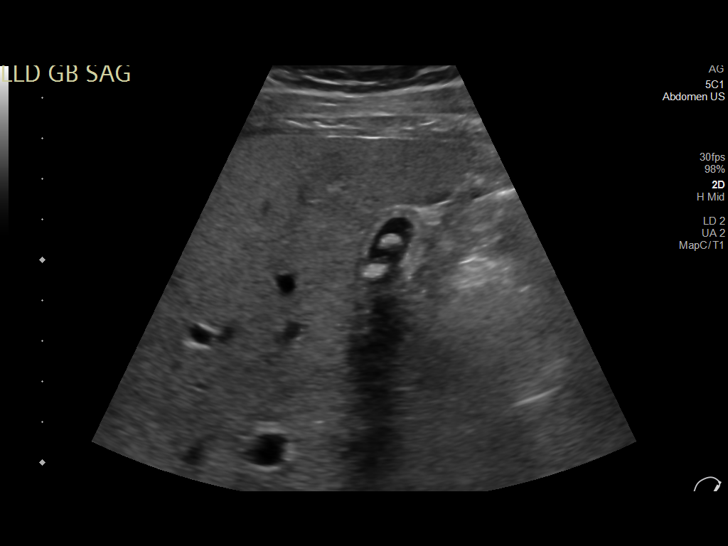
[im 10/28]
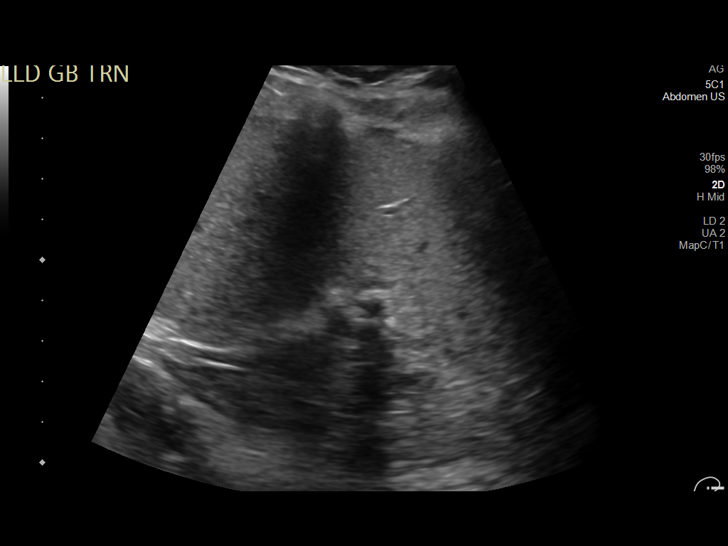
[im 11/28]
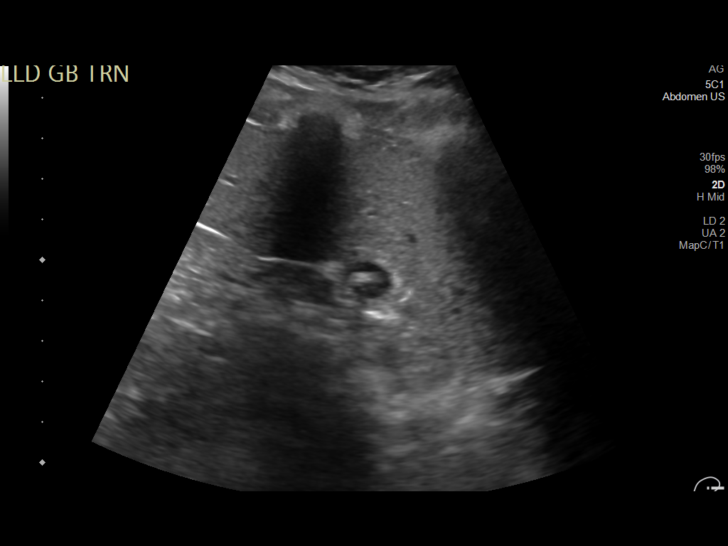
[im 13/28]
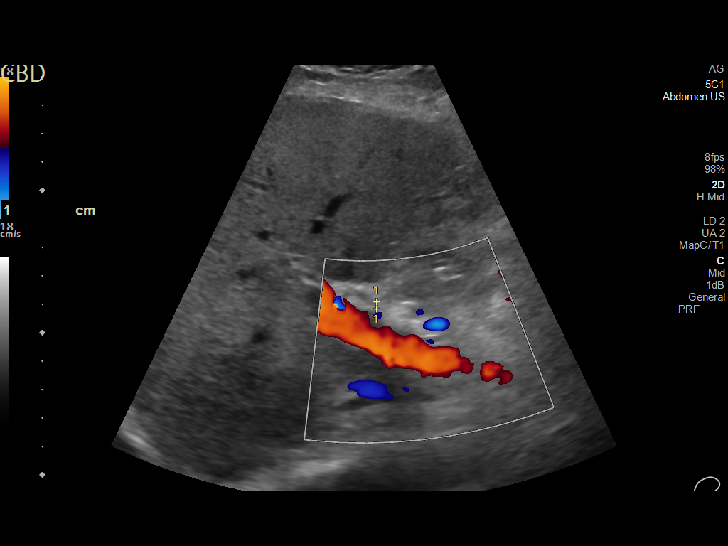
[im 15/28]
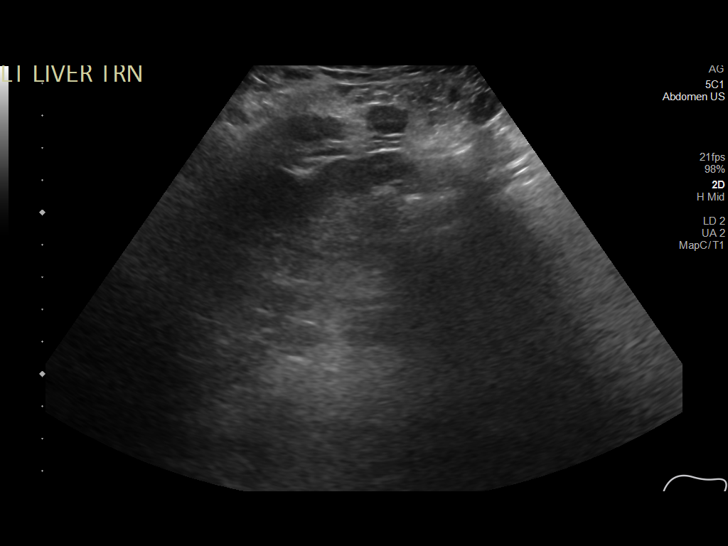
[im 17/28]
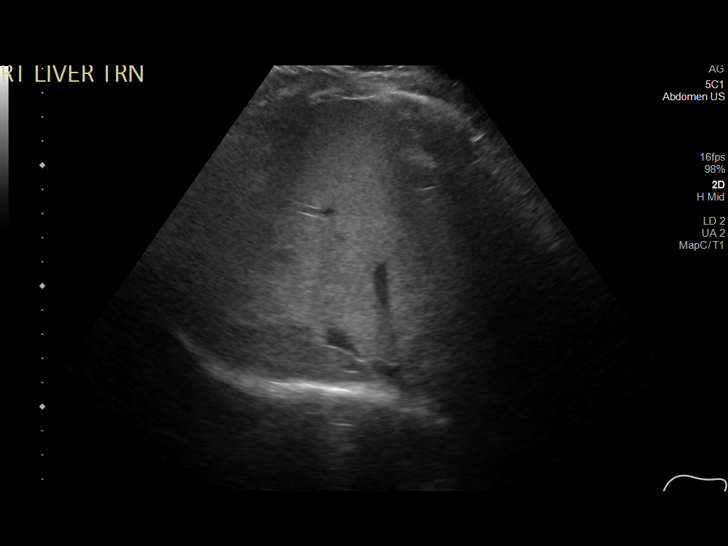
[im 19/28]
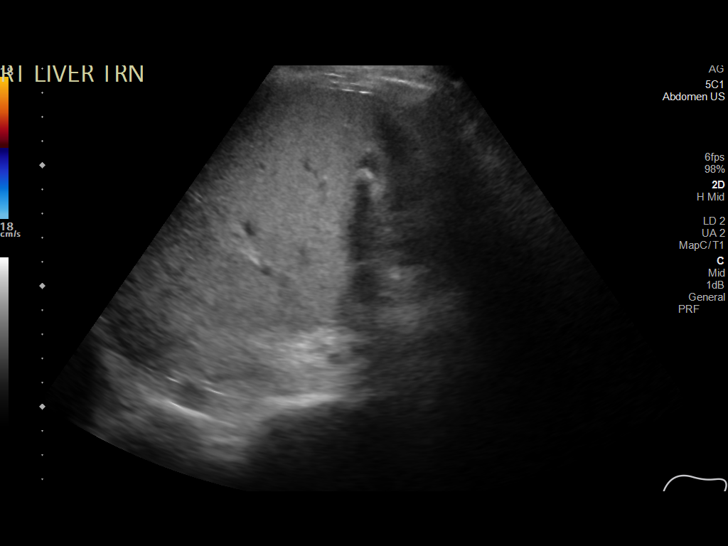
[im 21/28]
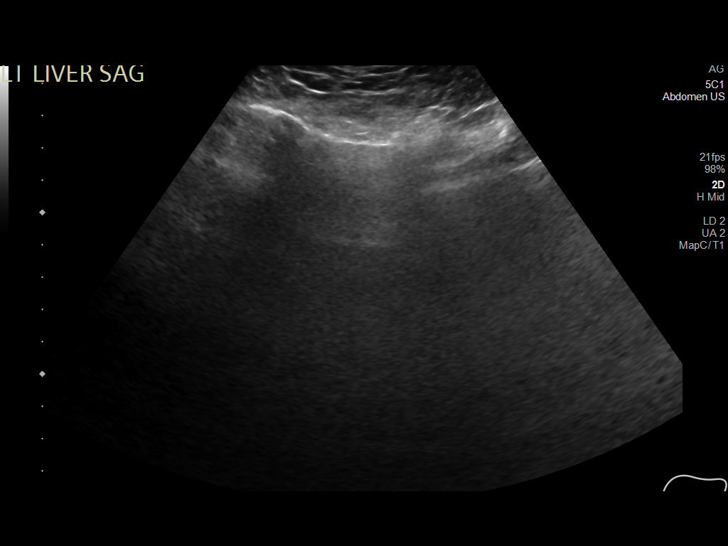
[im 23/28]
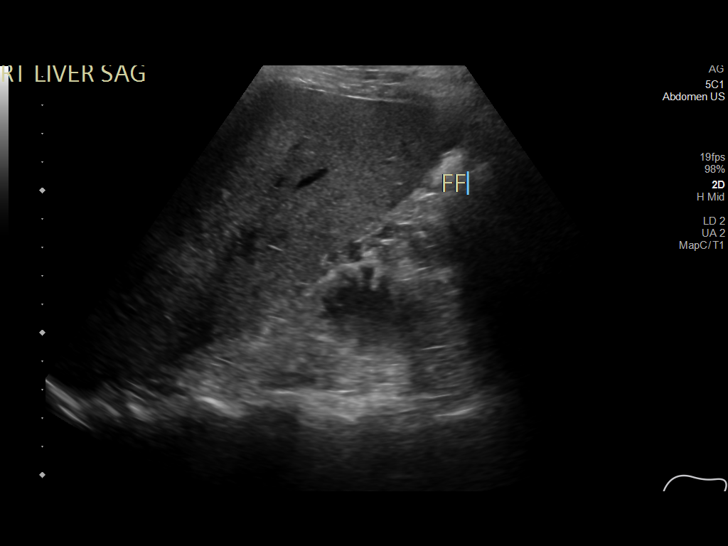
[im 25/28]
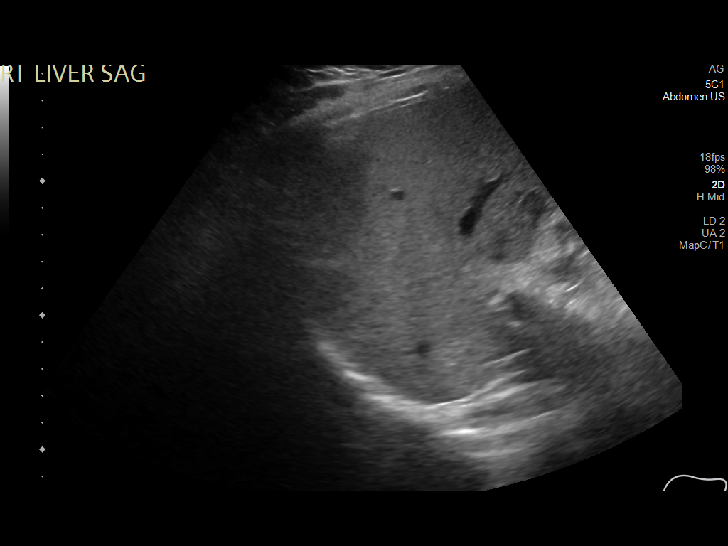
[im 28/28]
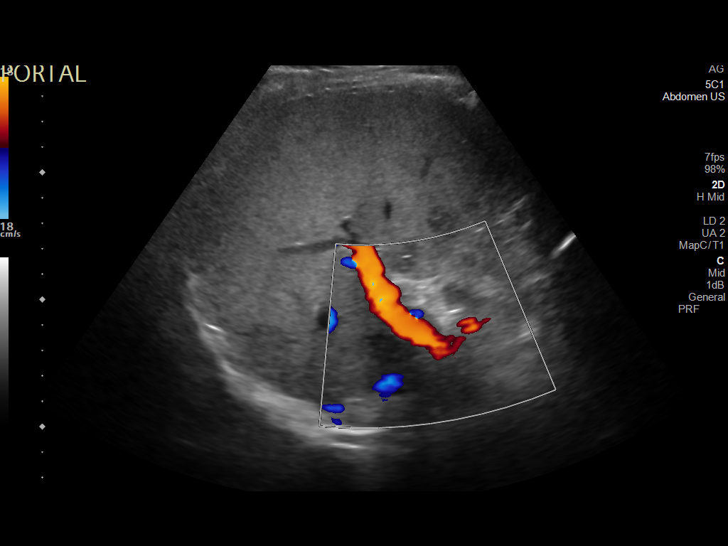

[14 of 25 positions shown; findings below may reference images not displayed]

FINDINGS: Gallbladder:

Decompressed with multiple gallstones within. No pericholecystic
fluid is noted. Negative sonographic Tiger'Berali sign is elicited.

Common bile duct:

Diameter: 3 mm.

Liver:

Mild increased echogenicity is noted consistent with fatty
infiltration. No focal mass is seen. Portal vein is patent on color
Doppler imaging with normal direction of blood flow towards the
liver.

Other: None.
IMPRESSION: Decompressed gallbladder with multiple gallstones within.

Fatty liver.

No acute abnormality noted.

## 2021-04-05 IMAGING — DX DG ABD PORTABLE 1V
1 series · 2 of 2 positions shown · non-contrast
Comparison: September 30, 2020.

CLINICAL DATA: Small bowel obstruction.

EXAM:
PORTABLE ABDOMEN - 1 VIEW

[Series 1: abdomen · 0.14mm/px · 2 of 2 slices shown]
[im 1/2]
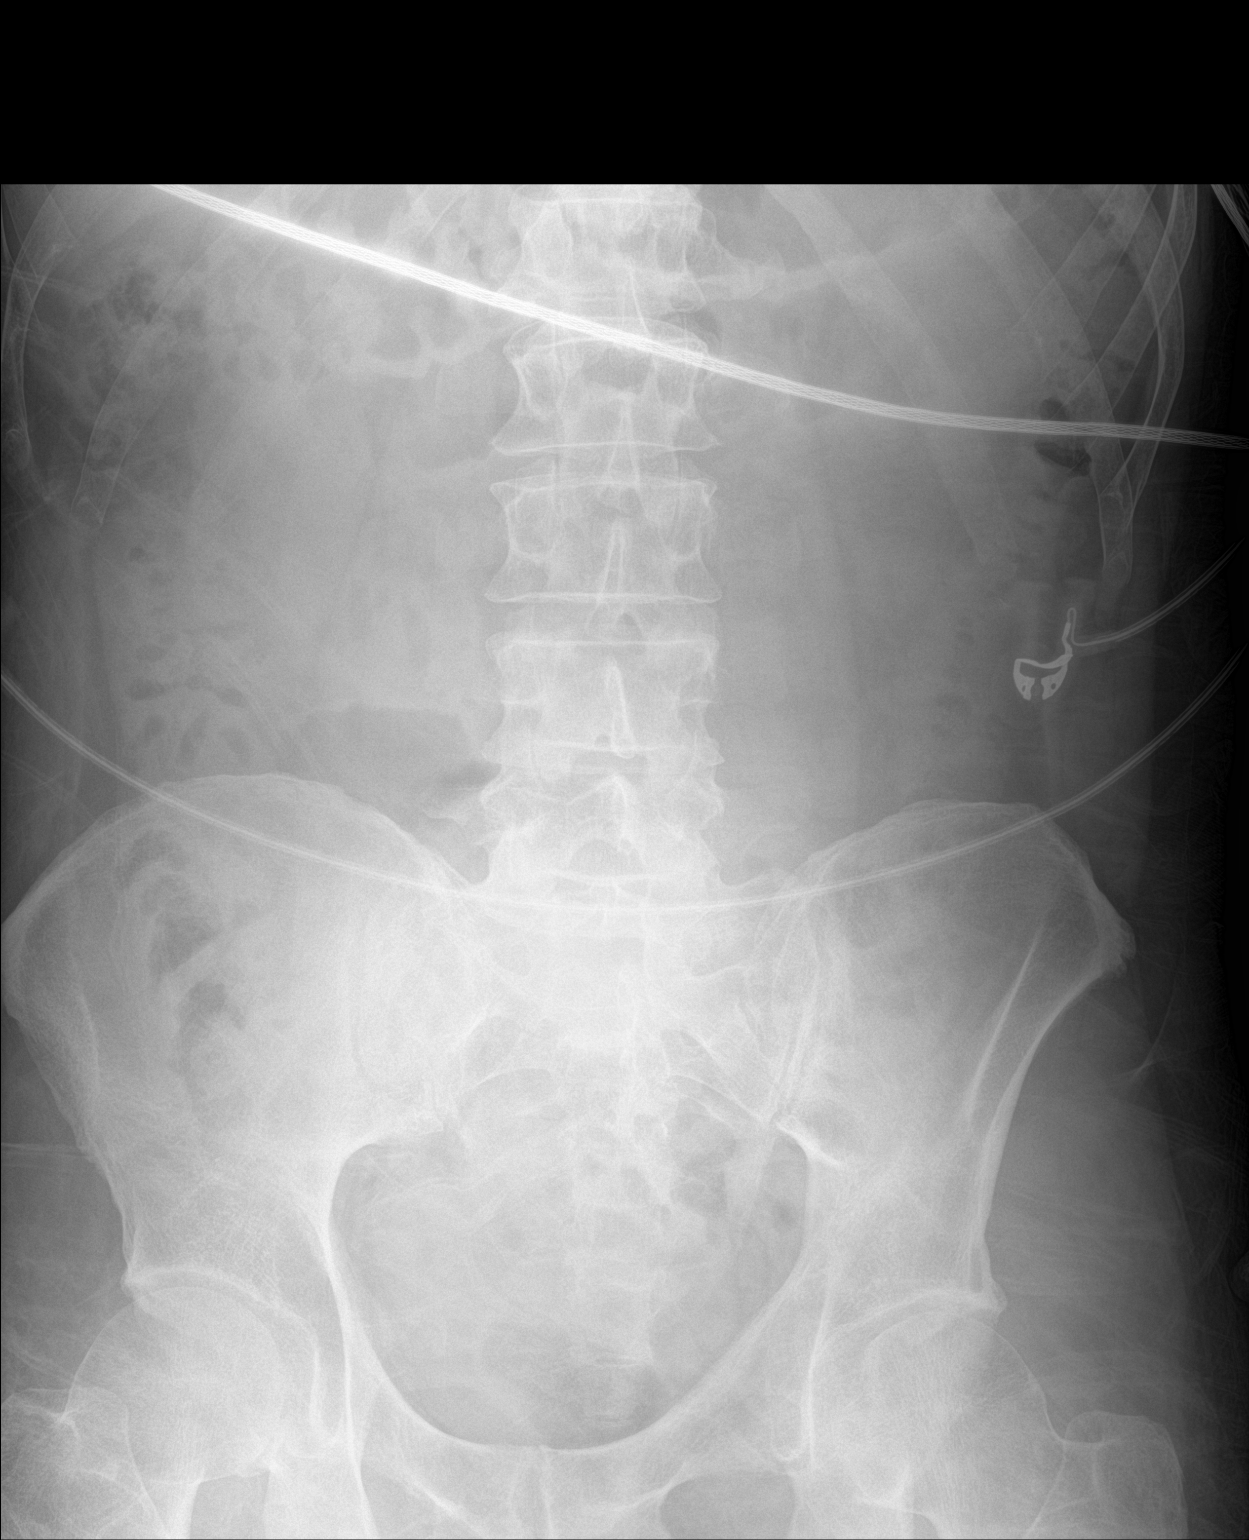
[im 2/2]
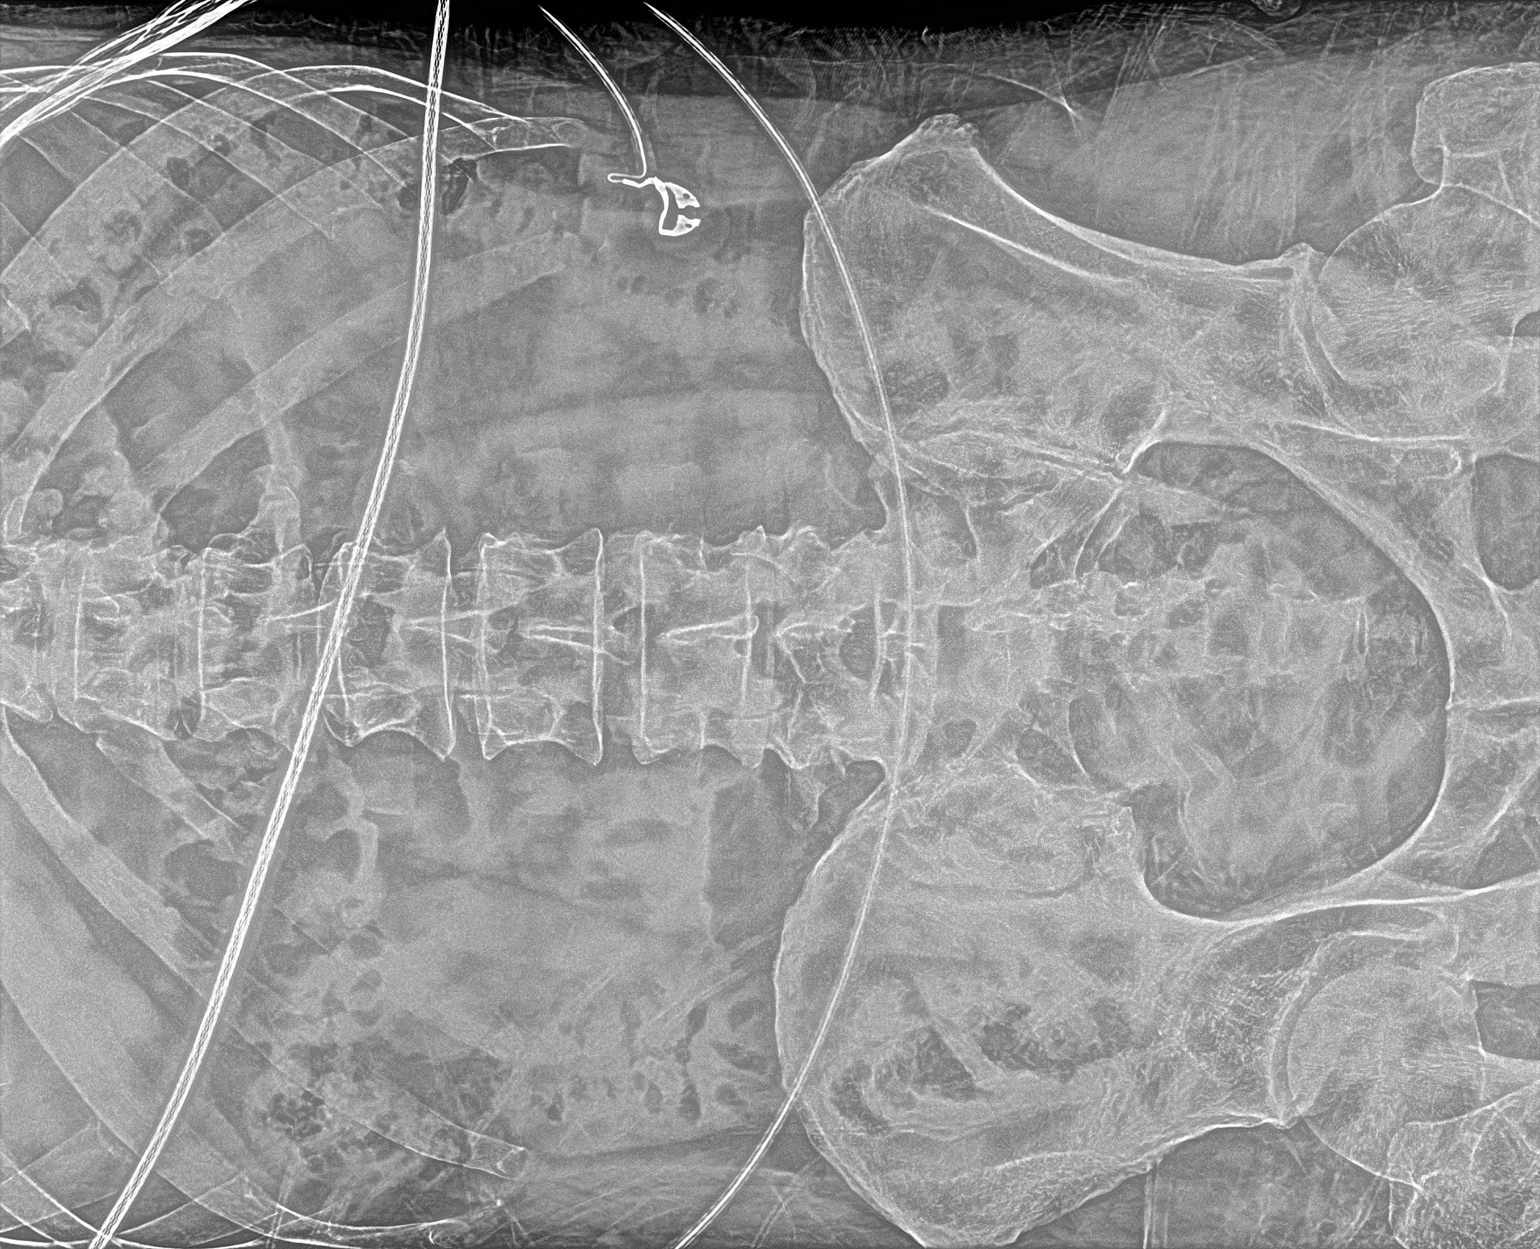

[2 of 2 positions shown; findings below may reference images not displayed]

FINDINGS: Mildly dilated small bowel loop is noted in left upper quadrant
which is improved compared to prior exam. No free air is noted on
decubitus view. No radio-opaque calculi or other significant
radiographic abnormality are seen.
IMPRESSION: Decreased small bowel dilatation is noted compared to prior exam.

## 2021-04-07 IMAGING — CR DG ABDOMEN 1V
1 series · 1 of 1 positions shown · non-contrast
Comparison: October 01, 2020.

CLINICAL DATA: Small bowel obstruction.

EXAM:
ABDOMEN - 1 VIEW

[abdomen kub]
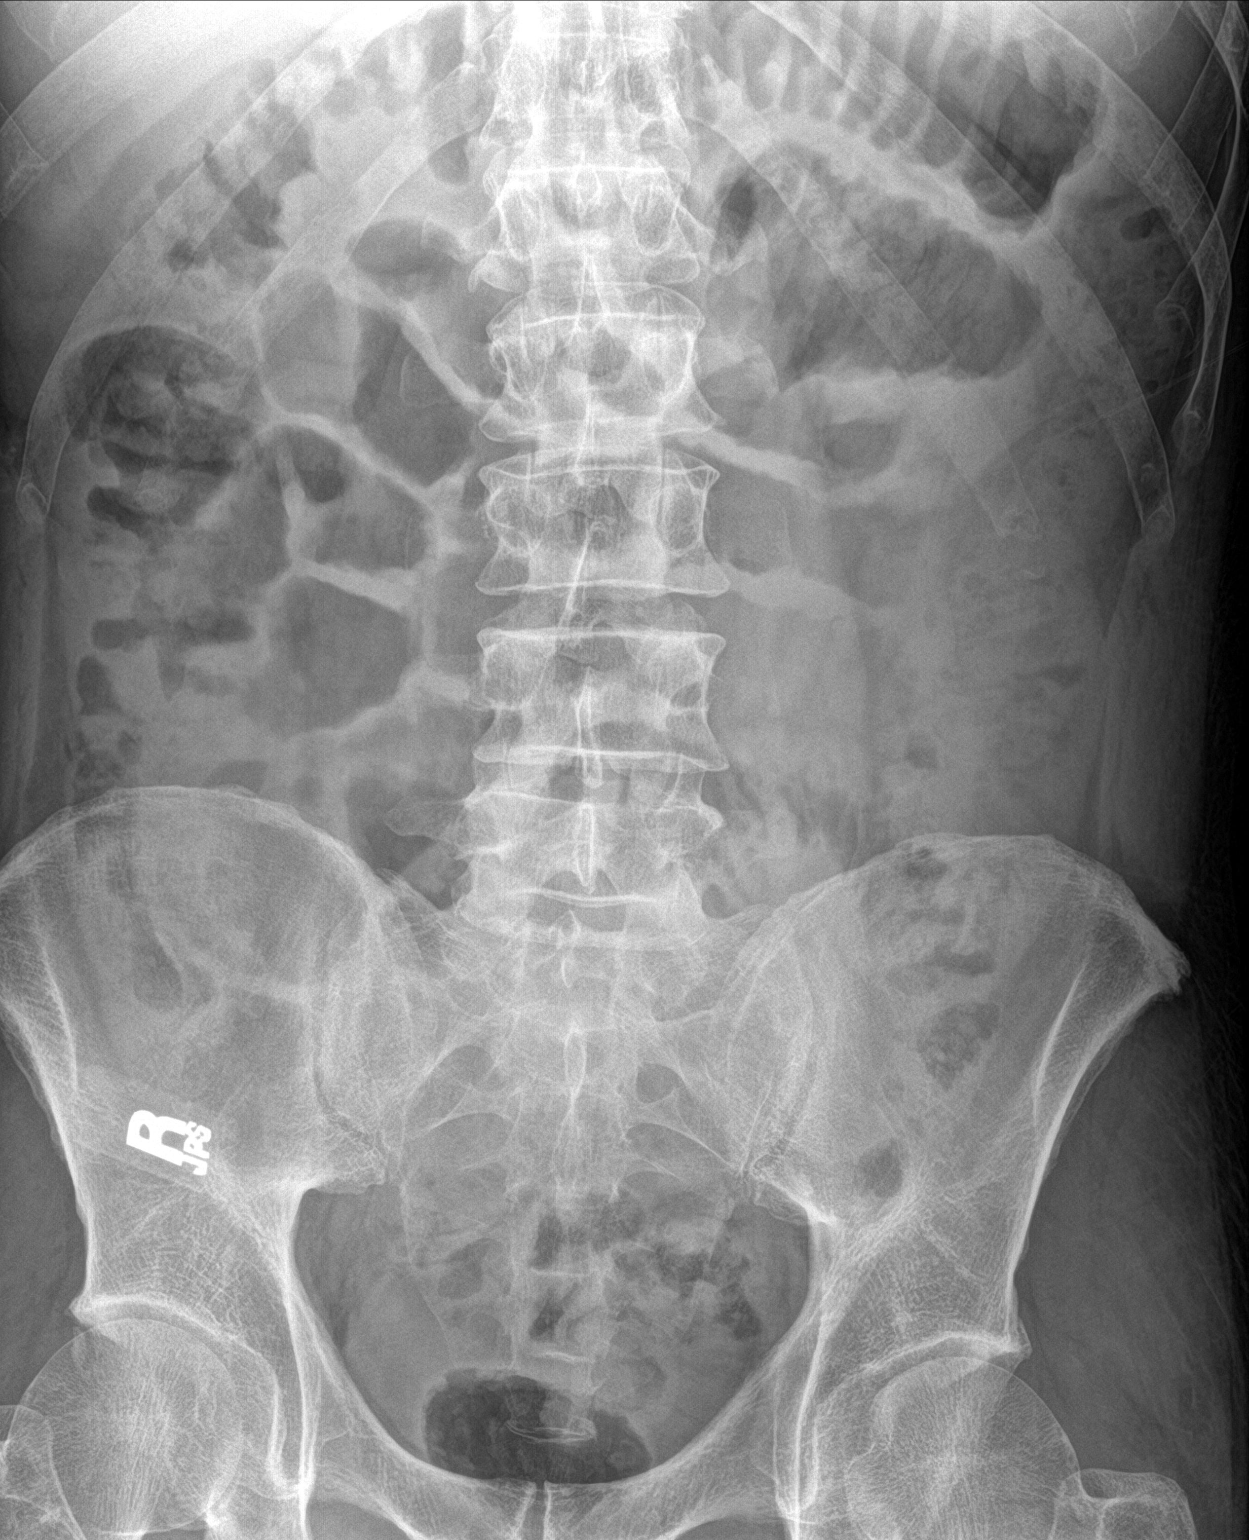

[1 of 1 positions shown; findings below may reference images not displayed]

FINDINGS: Mildly dilated small bowel loops are noted in the left upper
quadrant the abdomen concerning for ileus or possibly distal small
bowel obstruction. This is mildly more prominent compared to prior
exam. No radio-opaque calculi or other significant radiographic
abnormality are seen.
IMPRESSION: Mildly dilated small bowel loops are noted in left upper quadrant
the abdomen concerning for ileus or possibly distal small bowel
obstruction. This is mildly more prominent compared to prior exam.

## 2022-10-05 ENCOUNTER — Ambulatory Visit: Payer: Medicaid Other | Attending: Audiologist | Admitting: Audiologist

## 2022-10-05 DIAGNOSIS — H9193 Unspecified hearing loss, bilateral: Secondary | ICD-10-CM | POA: Insufficient documentation

## 2022-10-05 NOTE — Procedures (Signed)
  [facility] [place], [place]  [id number] [phone]  AUDIOLOGICAL  EVALUATION  NAME: AUTHOR HATLESTAD     DOB:   1976/10/11      MRN: [id number]                                                                                     DATE: 10/05/2022     REFERENT: Peter Minium, MD STATUS: Outpatient DIAGNOSIS: Decreased Hearing    History: Vincent Singh was seen for an audiological evaluation. Kem was accompanied to the appointment by his caretaker who waited in the lobby.  [place] is receiving a hearing evaluation due need for routine testing. No pain or pressure reported in either ear.  Tinnitus denied for both ears. Konstantin has a cognitive impairment. Limited case history obtained that was relevant to hearing.   Medical history negative for a condition which is a risk factor for hearing loss. No other relevant case history reported.   Evaluation:  Otoscopy showed a slight view of the tympanic membranes due to cerumen, bilaterally worse in left ear Tympanometry results were consistent with normal middle ear function, bilaterally   Audiometric testing was completed using conventional audiometry with supraural transducer. Speech Recognition Thresholds were 20 dB in the right ear and 20 dB in the left ear. Word Recognition was performed 40 dB SL, scored 100% in the right ear and 100% in the left ear. Pure tone thresholds show normal hearing in each ear.   Results:  The test results were reviewed with Deldrick and his caretaker. Hearing is normal in each ear. There is significant wax in the left ear but it is not blocking hearing.    Recommendations: 1.   No further audiologic testing is needed unless future hearing concerns arise.    23 minutes spent testing and counseling on results.   Alfonse Alpers  Audiologist, Au.D., CCC-A 10/05/2022  9:24 AM  Cc: Peter Minium, MD
